# Patient Record
Sex: Female | Born: 1955 | Race: White | Hispanic: No | Marital: Single | Smoking: Former smoker
Health system: Southern US, Community
[De-identification: ages and names within clinical notes are randomized; demographics above are authoritative.]

## PROBLEM LIST (undated history)

## (undated) DIAGNOSIS — F419 Anxiety disorder, unspecified: Secondary | ICD-10-CM

## (undated) DIAGNOSIS — F32A Depression, unspecified: Secondary | ICD-10-CM

## (undated) DIAGNOSIS — H269 Unspecified cataract: Secondary | ICD-10-CM

## (undated) DIAGNOSIS — F329 Major depressive disorder, single episode, unspecified: Secondary | ICD-10-CM

## (undated) DIAGNOSIS — N3281 Overactive bladder: Secondary | ICD-10-CM

## (undated) HISTORY — DX: Unspecified cataract: H26.9

## (undated) HISTORY — PX: EYE SURGERY: SHX253

## (undated) HISTORY — DX: Anxiety disorder, unspecified: F41.9

## (undated) HISTORY — PX: CORNEAL TRANSPLANT: SHX108

---

## 1983-10-11 HISTORY — PX: LAMELLAR KERATOPLASTY: SHX1911

## 1994-10-10 HISTORY — PX: OTHER SURGICAL HISTORY: SHX169

## 2009-10-10 LAB — CONVERTED CEMR LAB: Pap Smear: NORMAL

## 2009-10-10 LAB — HM PAP SMEAR

## 2009-10-15 LAB — CONVERTED CEMR LAB
LDL Cholesterol: 94 mg/dL
LDL Cholesterol: 94 mg/dL

## 2010-10-27 ENCOUNTER — Encounter: Payer: Self-pay | Admitting: Family Medicine

## 2010-11-09 ENCOUNTER — Ambulatory Visit
Admission: RE | Admit: 2010-11-09 | Discharge: 2010-11-09 | Payer: Self-pay | Source: Home / Self Care | Attending: Family Medicine | Admitting: Family Medicine

## 2010-11-09 DIAGNOSIS — N318 Other neuromuscular dysfunction of bladder: Secondary | ICD-10-CM | POA: Insufficient documentation

## 2010-11-09 DIAGNOSIS — G4700 Insomnia, unspecified: Secondary | ICD-10-CM | POA: Insufficient documentation

## 2010-11-09 DIAGNOSIS — B0052 Herpesviral keratitis: Secondary | ICD-10-CM | POA: Insufficient documentation

## 2010-11-16 ENCOUNTER — Other Ambulatory Visit: Payer: Self-pay | Admitting: Family Medicine

## 2010-11-16 ENCOUNTER — Other Ambulatory Visit: Payer: Self-pay

## 2010-11-16 ENCOUNTER — Encounter (INDEPENDENT_AMBULATORY_CARE_PROVIDER_SITE_OTHER): Payer: Self-pay | Admitting: *Deleted

## 2010-11-16 DIAGNOSIS — Z1211 Encounter for screening for malignant neoplasm of colon: Secondary | ICD-10-CM

## 2010-11-16 LAB — FECAL OCCULT BLOOD, GUAIAC: Fecal Occult Blood: NEGATIVE

## 2010-11-16 LAB — FECAL OCCULT BLOOD, IMMUNOCHEMICAL: Fecal Occult Bld: NEGATIVE

## 2010-11-17 NOTE — Assessment & Plan Note (Signed)
Summary: NEW PT TO EST/CLE   Vital Signs:  Patient profile:   55 year old female Height:      67.5 inches Weight:      158.25 pounds BMI:     24.51 Temp:     98.6 degrees F oral Pulse rate:   64 / minute Pulse rhythm:   regular BP sitting:   120 / 80  (left arm)  Vitals Entered By: Benny Lennert CMA Duncan Dull) (November 09, 2010 9:49 AM)  History of Present Illness: Chief complaint new patient to be established   Here to establish PCP.Marland Kitchen moved from [place].  Overactive bladder.. well controlle don vesicare.  Insomnia, chronic...lifelong. Uses ambien as needed.Marland Kitchen about 1/3 tab 4-5 times aweek.  Preventive Screening-Counseling & Management  Alcohol-Tobacco     Smoking Status: never  Caffeine-Diet-Exercise     Does Patient Exercise: yes      Drug Use:  no.    Allergies (verified): No Known Drug Allergies  Past History:  Past Surgical History: Partial lamellar keratoplasty 1985 keraplasty 1996  Family History: father: deceased age 76 oral cancer due to tobacco mother: HTN, osteoarthritis three brothers: healthy Paunt... MS  Social History: Occupation: professor at [employer].Marland KitchenMarland KitchenPA Single No sexually active Married Never Smoked Alcohol use-yes, once a year Drug use-no Regular exercise-yes.Marland Kitchen daily treadmill Diet:  veggies, water minimal Occupation:  employed Smoking Status:  never Drug Use:  no Does Patient Exercise:  yes  Review of Systems General:  Denies fatigue and fever. CV:  Denies chest pain or discomfort. Resp:  Denies shortness of breath. GI:  Denies abdominal pain and bloody stools. GU:  Denies dysuria. Psych:  Denies anxiety and depression.  Physical Exam  General:  Well-developed,well-nourished,in no acute distress; alert,appropriate and cooperative throughout examination Ears:  External ear exam shows no significant lesions or deformities.  Otoscopic examination reveals clear canals, tympanic membranes are intact bilaterally without bulging,  retraction, inflammation or discharge. Hearing is grossly normal bilaterally. Nose:  External nasal examination shows no deformity or inflammation. Nasal mucosa are pink and moist without lesions or exudates. Mouth:  Oral mucosa and oropharynx without lesions or exudates.  Teeth in good repair. Neck:  no carotid bruit or thyromegaly no cervical or supraclavicular lymphadenopathy  Chest Wall:  No deformities, masses, or tenderness noted. Breasts:  No mass, nodules, thickening, tenderness, bulging, retraction, inflamation, nipple discharge or skin changes noted.   Lungs:  Normal respiratory effort, chest expands symmetrically. Lungs are clear to auscultation, no crackles or wheezes. Heart:  Normal rate and regular rhythm. S1 and S2 normal without gallop, murmur, click, rub or other extra sounds. Abdomen:  Bowel sounds positive,abdomen soft and non-tender without masses, organomegaly or hernias noted. Genitalia:  Pelvic Exam:        External: normal female genitalia without lesions or masses        Vagina: normal without lesions or masses        Cervix: not visualized         Adnexa: normal bimanual exam without masses or fullness        Uterus: normal by palpation        Pap smear: not performed Msk:  No deformity or scoliosis noted of thoracic or lumbar spine.   Pulses:  R and L posterior tibial pulses are full and equal bilaterally  Extremities:  no edema  Neurologic:  No cranial nerve deficits noted. Station and gait are normal. Plantar reflexes are down-going bilaterally. DTRs are symmetrical throughout. Sensory, motor and coordinative  functions appear intact. Skin:  Intact without suspicious lesions or rashes Psych:  Cognition and judgment appear intact. Alert and cooperative with normal attention span and concentration. No apparent delusions, illusions, hallucinations   Impression & Recommendations:  Problem # 1:  PREVENTIVE HEALTH CARE (ICD-V70.0) The patient's preventative  maintenance and recommended screening tests for an annual wellness exam were reviewed in full today. Brought up to date unless services declined.  Counselled on the importance of diet, exercise, and its role in overall health and mortality. The patient's FH and SH was reviewed, including their home life, tobacco status, and drug and alcohol status.     Problem # 2:  ROUTINE GYNECOLOGICAL EXAMINATION (ICD-V72.31) PAP in past yearly and all normal...have documentation form previous MD. DVE, no pap this year... PAPs q 2 years then space to q3 years.   Problem # 3:  INSOMNIA, CHRONIC (ICD-307.42) Med refilled. Use on limited basis to avoid tolerance. Sleep hygeine reviewed.   Complete Medication List: 1)  Vesicare 5 Mg Tabs (Solifenacin succinate) .... Take one tablet daily 2)  Ambien 10 Mg Tabs (Zolpidem tartrate) .... 1/3 tab po at bedtime as needed insomnia  Patient Instructions: 1)  Make sue to get fasting labs at [facility]. Marland Kitchen 2)  Go ahead and set up mammogram through [facility]. 3)  Stool cards.. return.  4)  Please schedule a follow-up appointment in 1 year.  Prescriptions: AMBIEN 10 MG TABS (ZOLPIDEM TARTRATE) 1/3 tab po at bedtime as needed insomnia  #30 x 0   Entered and Authorized by:   [name] MD   Signed by:   [name] MD on 11/09/2010   Method used:   Print then Give to Patient   RxID:   [id number]    Orders Added: 1)  New Patient 40-64 years [[id number]]    Current Allergies (reviewed today): No known allergies  Flu Vaccine Result Date:  07/10/2010 Flu Vaccine Result:  given Flu Vaccine Next Due:  1 yr TD Result Date:  10/11/2003 TD Result:  given TD Next Due:  10 yr HDL Result Date:  10/15/2009 HDL Result:  86 HDL Next Due:  1 yr LDL Result Date:  10/15/2009 LDL Result:  94 LDL Next Due:  1 yr Flex Sig Next Due:  Not Indicated PAP Result Date:  10/10/2009 PAP Result:  normal PAP Next Due:  1 yr Mammogram Result Date:  10/10/2009 Mammogram Result:   normal Mammogram Next Due:  1 yr Never had a bone density... no family history, no smoking, menses age 4, menopause age 21, no steroids.

## 2010-11-25 NOTE — Letter (Signed)
Summary: Patient Medical Records  Patient Medical Records   Imported By: Kassie Mends 11/16/2010 10:09:12  _____________________________________________________________________  External Attachment:    Type:   Image     Comment:   External Document

## 2010-11-25 NOTE — Letter (Signed)
Summary: Results Follow up Letter  [place] [facility]  [address] [place], [place] [address]   Phone: [phone]  Fax: [phone]    11/16/2010 MRN: [id number]      Margaret Short [address] [place], [place]  [address]  [place]     Dear Ms. Stelmach,  The following are the results of your recent test(s):  Test         Result    Pap Smear:        Normal _____  Not Normal _____ Comments: ______________________________________________________ Cholesterol: LDL(Bad cholesterol):         Your goal is less than:         HDL (Good cholesterol):       Your goal is more than: Comments:  ______________________________________________________ Mammogram:        Normal _____  Not Normal _____ Comments:  ___________________________________________________________________ Hemoccult:        Normal __x___  Not normal _______ Comments:  Repeat in 1 year  _____________________________________________________________________ Other Tests:    We routinely do not discuss normal results over the telephone.  If you desire a copy of the results, or you have any questions about this information we can discuss them at your next office visit.   Sincerely,  [name] MD

## 2010-12-15 ENCOUNTER — Ambulatory Visit: Payer: Self-pay | Admitting: Family Medicine

## 2010-12-15 ENCOUNTER — Encounter: Payer: Self-pay | Admitting: Family Medicine

## 2010-12-22 LAB — HM MAMMOGRAPHY: HM Mammogram: NORMAL

## 2010-12-23 ENCOUNTER — Encounter (INDEPENDENT_AMBULATORY_CARE_PROVIDER_SITE_OTHER): Payer: Self-pay | Admitting: *Deleted

## 2010-12-28 NOTE — Letter (Signed)
Summary: Results Follow up Letter  [place] [facility]  [address] [place], [place] [address]   Phone: [phone]  Fax: [phone]    12/23/2010 MRN: [id number]     Margaret Short [address] [place], [place]  [address]  [place]     Dear [name],  The following are the results of your recent test(s):  Test         Result    Pap Smear:        Normal _____  Not Normal _____ Comments: ______________________________________________________ Cholesterol: LDL(Bad cholesterol):         Your goal is less than:         HDL (Good cholesterol):       Your goal is more than: Comments:  ______________________________________________________ Mammogram:        Normal __x___  Not Normal _____ Comments:Repeat in 1 year  ___________________________________________________________________ Hemoccult:        Normal _____  Not normal _______ Comments:    _____________________________________________________________________ Other Tests:    We routinely do not discuss normal results over the telephone.  If you desire a copy of the results, or you have any questions about this information we can discuss them at your next office visit.   Sincerely,  [name] MD

## 2011-03-03 ENCOUNTER — Other Ambulatory Visit: Payer: Self-pay | Admitting: *Deleted

## 2011-03-03 MED ORDER — ZOLPIDEM TARTRATE 10 MG PO TABS: ORAL_TABLET | ORAL | Status: DC

## 2011-04-20 ENCOUNTER — Encounter: Payer: Self-pay | Admitting: Family Medicine

## 2011-04-26 ENCOUNTER — Encounter: Payer: Self-pay | Admitting: Family Medicine

## 2011-04-26 ENCOUNTER — Ambulatory Visit (INDEPENDENT_AMBULATORY_CARE_PROVIDER_SITE_OTHER): Payer: BC Managed Care – PPO | Admitting: Family Medicine

## 2011-04-26 DIAGNOSIS — N318 Other neuromuscular dysfunction of bladder: Secondary | ICD-10-CM

## 2011-04-26 MED ORDER — SOLIFENACIN SUCCINATE 5 MG PO TABS: 10.0000 mg | ORAL_TABLET | Freq: Every day | ORAL | Status: DC

## 2011-04-26 MED ORDER — SOLIFENACIN SUCCINATE 5 MG PO TABS: 5.0000 mg | ORAL_TABLET | Freq: Every day | ORAL | Status: DC

## 2011-04-26 NOTE — Progress Notes (Signed)
  Subjective:    Patient ID: Margaret Short, female    DOB: August 04, 1956, 55 y.o.   MRN: [id number]  HPI Overactive bladder.. well controlled on vesicare.  Higher dose in past was to extensive. She does still get up 3 times at night.  Insomnia, chronic...lifelong.  Uses ambien as needed.Marland Kitchen about 1/3 tab 4-5 times a week. She does not need any refills of this yet.     Review of Systems  Constitutional: Negative for fever and fatigue.  Cardiovascular: Negative for chest pain and palpitations.  Gastrointestinal: Negative for nausea, vomiting, abdominal pain, diarrhea and constipation.  Genitourinary: Positive for dysuria. Negative for flank pain, vaginal bleeding, vaginal discharge and vaginal pain.       Objective:   Physical Exam  Constitutional: Vital signs are normal. She appears well-developed and well-nourished. She is cooperative.  Non-toxic appearance. She does not appear ill. No distress.  HENT:  Right Ear: Hearing, tympanic membrane and ear canal normal.  Left Ear: Hearing, tympanic membrane and ear canal normal.  Nose: Nose normal.  Eyes: Lids are normal. No foreign bodies found.  Neck: Trachea normal. Carotid bruit is not present. No mass present.  Cardiovascular: Normal rate, regular rhythm, S1 normal, S2 normal, normal heart sounds and intact distal pulses.  Exam reveals no gallop.   No murmur heard. Pulmonary/Chest: Effort normal and breath sounds normal. No respiratory distress. She has no wheezes. She has no rhonchi. She has no rales.  Abdominal: Soft. Normal appearance and bowel sounds are normal. She exhibits no distension, no fluid wave, no abdominal bruit and no mass. There is no hepatosplenomegaly. There is no tenderness. There is no rebound, no guarding and no CVA tenderness. No hernia.  Skin: Skin is warm, dry and intact.          Assessment & Plan:

## 2011-04-26 NOTE — Assessment & Plan Note (Signed)
Doing well overall on vesicare low dose. Does not wish to increase. Refilled med to mail order.

## 2011-04-26 NOTE — Patient Instructions (Signed)
Follow up when due for complete physical exam, or earlier if needed.

## 2011-04-27 ENCOUNTER — Telehealth: Payer: Self-pay | Admitting: *Deleted

## 2011-04-27 NOTE — Telephone Encounter (Signed)
Clarification is needed on directions for vesicare, form is on your desk.

## 2011-04-28 NOTE — Telephone Encounter (Signed)
Pt called back to ask when script will be straightened out, advised her we will fax form on Friday, after Dr. Ermalene Searing returns to office.

## 2011-05-24 ENCOUNTER — Other Ambulatory Visit: Payer: Self-pay | Admitting: *Deleted

## 2011-05-24 MED ORDER — ZOLPIDEM TARTRATE 10 MG PO TABS: 10.0000 mg | ORAL_TABLET | Freq: Every evening | ORAL | Status: DC | PRN

## 2011-05-24 NOTE — Telephone Encounter (Signed)
Pt is asking that a 90 day supply be sent to [facility].

## 2011-05-24 NOTE — Telephone Encounter (Signed)
Printed and will fax to [facility]

## 2011-07-08 ENCOUNTER — Ambulatory Visit (INDEPENDENT_AMBULATORY_CARE_PROVIDER_SITE_OTHER): Payer: BC Managed Care – PPO | Admitting: Family Medicine

## 2011-07-08 ENCOUNTER — Encounter: Payer: Self-pay | Admitting: Family Medicine

## 2011-07-08 DIAGNOSIS — F324 Major depressive disorder, single episode, in partial remission: Secondary | ICD-10-CM | POA: Insufficient documentation

## 2011-07-08 DIAGNOSIS — F341 Dysthymic disorder: Secondary | ICD-10-CM

## 2011-07-08 DIAGNOSIS — F418 Other specified anxiety disorders: Secondary | ICD-10-CM

## 2011-07-08 MED ORDER — VENLAFAXINE HCL ER 37.5 MG PO TB24: ORAL_TABLET | ORAL | Status: DC

## 2011-07-08 NOTE — Patient Instructions (Signed)
Start venlafaxine daily.. Increase if tolerated. Call if interested in counseling or if having new issues.

## 2011-07-08 NOTE — Progress Notes (Signed)
  Subjective:    Patient ID: Margaret Short, female    DOB: 12-May-1956, 55 y.o.   MRN: [id number]  HPI  55 year old female presents with  6 months of anxiety and depression. Worse in last 1 month. Increase stress and responsibilities... Misses home in [place]. 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 Difficulty coping and concentrating. Tearfulful. Sleep moderate control.. Using Ambien frequently. Decreased motivation. No SI... Belief system keeps her from doing this.  Has never been on medication for this before.      Review of Systems  Constitutional: Positive for fatigue. Negative for fever and chills.       No cold intolerence or dry skin  Respiratory: Negative for shortness of breath.   Cardiovascular: Negative for chest pain.       Objective:   Physical Exam  Constitutional: Vital signs are normal. She appears well-developed and well-nourished. She is cooperative.  Non-toxic appearance. She does not appear ill. No distress.  HENT:  Head: Normocephalic.  Right Ear: Ear canal normal.  Nose: Mucosal edema and rhinorrhea present. Right sinus exhibits no maxillary sinus tenderness and no frontal sinus tenderness. Left sinus exhibits no maxillary sinus tenderness and no frontal sinus tenderness.  Mouth/Throat: Uvula is midline and mucous membranes are normal.  Eyes: Conjunctivae, EOM and lids are normal. Pupils are equal, round, and reactive to light. No foreign bodies found.  Neck: Trachea normal and normal range of motion. Neck supple. Carotid bruit is not present. No mass and no thyromegaly present.  Cardiovascular: Normal rate, regular rhythm, S1 normal, S2 normal, normal heart sounds, intact distal pulses and normal pulses.  Exam reveals no gallop and no friction rub.   No murmur heard. Pulmonary/Chest: Effort normal and breath sounds normal. Not tachypneic. No respiratory distress. She has no decreased breath sounds. She has no wheezes. She has no  rhonchi. She has no rales.  Genitourinary: Vagina normal and uterus normal.  Neurological: She is alert.  Skin: Skin is warm, dry and intact. No rash noted.  Psychiatric: Her speech is normal and behavior is normal. Judgment normal. Her mood appears not anxious. Cognition and memory are normal. She does not exhibit a depressed mood.          Assessment & Plan:

## 2011-07-08 NOTE — Assessment & Plan Note (Signed)
Not interested in counseling given time constraints. NO SI. Start venlafaxine daily... Follow up in 1 month.

## 2011-07-18 ENCOUNTER — Telehealth: Payer: Self-pay | Admitting: *Deleted

## 2011-07-18 NOTE — Telephone Encounter (Signed)
Pt called to report that she stopped effexor due to having diarrhea.  She wants to start generic zoloft. Uses [facility].

## 2011-07-19 NOTE — Telephone Encounter (Signed)
Left message on machine to call back  

## 2011-07-19 NOTE — Telephone Encounter (Signed)
Not a super common SE of effexor.Marland Kitchen Has it stopped completely off effexor?

## 2011-07-20 MED ORDER — SERTRALINE HCL 50 MG PO TABS: 50.0000 mg | ORAL_TABLET | Freq: Every day | ORAL | Status: DC

## 2011-07-20 NOTE — Telephone Encounter (Signed)
Notify pt .Marland Kitchen Sertraline sent in.

## 2011-07-20 NOTE — Telephone Encounter (Signed)
Patient advised.

## 2011-07-20 NOTE — Telephone Encounter (Signed)
Pt states diarrhea has totally stopped since being off effexor.

## 2011-08-08 ENCOUNTER — Ambulatory Visit (INDEPENDENT_AMBULATORY_CARE_PROVIDER_SITE_OTHER): Payer: BC Managed Care – PPO | Admitting: Family Medicine

## 2011-08-08 ENCOUNTER — Encounter: Payer: Self-pay | Admitting: Family Medicine

## 2011-08-08 VITALS — BP 130/80 | HR 78 | Temp 98.8°F | Ht 67.5 in | Wt 160.8 lb

## 2011-08-08 DIAGNOSIS — F418 Other specified anxiety disorders: Secondary | ICD-10-CM

## 2011-08-08 DIAGNOSIS — F341 Dysthymic disorder: Secondary | ICD-10-CM

## 2011-08-08 MED ORDER — SERTRALINE HCL 50 MG PO TABS: 50.0000 mg | ORAL_TABLET | Freq: Every day | ORAL | Status: DC

## 2011-08-08 NOTE — Patient Instructions (Signed)
Continue sertraline at current dose.. Call if not improving for possible increase in dose.  Stop to speak with Shirlee Limerick about referral to counselor on your way out. Follow up in 2-3 weeks. Helpline [phone].

## 2011-08-08 NOTE — Assessment & Plan Note (Signed)
Poor control. Recommended counseling again and referral made. Continue sertraline longer , may need increase. Close follow up given worsening mood as opposed to improvement.

## 2011-08-08 NOTE — Progress Notes (Signed)
  Subjective:    Patient ID: Margaret Short, female    DOB: March 26, 1956, 55 y.o.   MRN: [id number]  HPI  55 year old femeale presents for follow up depression with anxiety, and chronic insomnia.  She is on sertraline 50 mg daily ( has been on for 2-3 weeks at this point) and ambien prn.  She is having a lot of dry mouth. She was initially put on effexor but stopped this due to severe diarrhea, now resolved. She reports that her mood is worse as opposed to better.. Continued diff concentrating, issues at work, not performing well, overwhelmed. No SI. Ambien is helping with sleep.  Not interested in xanax given no panic attacks, just generalized anxiety.  Minimal people to talk with about issues, but hard to find time for counseling (willing to try now)    Review of Systems  Constitutional: Negative for fever and fatigue.  Respiratory: Negative for chest tightness and shortness of breath.   Cardiovascular: Negative for chest pain, palpitations and leg swelling.  Genitourinary: Negative for dysuria.  Psychiatric/Behavioral: Positive for sleep disturbance, dysphoric mood, decreased concentration and agitation. Negative for suicidal ideas, hallucinations and self-injury. The patient is nervous/anxious.        Objective:   Physical Exam  Constitutional: She is oriented to person, place, and time. She appears well-developed and well-nourished.  Neck: Normal range of motion. Neck supple. No thyromegaly present.  Cardiovascular: Normal rate, regular rhythm, normal heart sounds and intact distal pulses.  Exam reveals no gallop and no friction rub.   No murmur heard. Pulmonary/Chest: Effort normal and breath sounds normal.  Abdominal: Soft. Bowel sounds are normal.  Neurological: She is alert and oriented to person, place, and time.  Psychiatric: Her speech is normal. Judgment and thought content normal. Her affect is blunt. She is slowed and withdrawn. Thought content is not paranoid.  Cognition and memory are normal. She exhibits a depressed mood. She expresses no suicidal ideation. She expresses no suicidal plans.          Assessment & Plan:

## 2011-08-23 ENCOUNTER — Ambulatory Visit (INDEPENDENT_AMBULATORY_CARE_PROVIDER_SITE_OTHER): Payer: BC Managed Care – PPO | Admitting: Psychology

## 2011-08-23 DIAGNOSIS — F4323 Adjustment disorder with mixed anxiety and depressed mood: Secondary | ICD-10-CM

## 2011-08-26 ENCOUNTER — Ambulatory Visit (INDEPENDENT_AMBULATORY_CARE_PROVIDER_SITE_OTHER): Payer: BC Managed Care – PPO | Admitting: Family Medicine

## 2011-08-26 ENCOUNTER — Encounter: Payer: Self-pay | Admitting: Family Medicine

## 2011-08-26 DIAGNOSIS — F341 Dysthymic disorder: Secondary | ICD-10-CM

## 2011-08-26 DIAGNOSIS — F418 Other specified anxiety disorders: Secondary | ICD-10-CM

## 2011-08-26 MED ORDER — ZOLPIDEM TARTRATE 10 MG PO TABS: 10.0000 mg | ORAL_TABLET | Freq: Every evening | ORAL | Status: DC | PRN

## 2011-08-26 NOTE — Patient Instructions (Signed)
Schedule CPX with fasting labs sometime next early year.

## 2011-08-26 NOTE — Assessment & Plan Note (Signed)
Great improvement on sertraline, continue likely for 6 months. Discussed not stopping med abruptly.

## 2011-08-26 NOTE — Progress Notes (Signed)
  Subjective:    Patient ID: Margaret Short, female    DOB: June 09, 1956, 55 y.o.   MRN: [id number]  HPI Depression: She report 99% better.  Able to sleep at night, calm, no tearfulness, no anxiety. She is only using 5 mg daily now. No SI, no HI.  Dry mouth SE tolerable.  She saw Dr. Laymond Purser  Last week.. No follow up planned.    Review of Systems  Constitutional: Negative for fever and fatigue.  HENT: Negative for ear pain.   Eyes: Negative for pain.  Respiratory: Negative for chest tightness and shortness of breath.   Cardiovascular: Negative for chest pain, palpitations and leg swelling.  Gastrointestinal: Negative for abdominal pain.  Genitourinary: Negative for dysuria.       Objective:   Physical Exam  Constitutional: She appears well-developed and well-nourished.  Cardiovascular: Normal rate, regular rhythm, normal heart sounds and intact distal pulses.  Exam reveals no gallop and no friction rub.   No murmur heard. Pulmonary/Chest: Effort normal and breath sounds normal. No respiratory distress. She has no wheezes. She has no rales. She exhibits no tenderness.  Psychiatric: She has a normal mood and affect. Her behavior is normal. Judgment and thought content normal.          Assessment & Plan:

## 2011-12-09 ENCOUNTER — Other Ambulatory Visit: Payer: Self-pay | Admitting: *Deleted

## 2011-12-09 MED ORDER — SERTRALINE HCL 50 MG PO TABS: 50.0000 mg | ORAL_TABLET | Freq: Every day | ORAL | Status: DC

## 2012-01-24 ENCOUNTER — Other Ambulatory Visit: Payer: Self-pay | Admitting: *Deleted

## 2012-01-24 NOTE — Telephone Encounter (Signed)
Last filled 08-26-2011

## 2012-01-25 MED ORDER — ZOLPIDEM TARTRATE 10 MG PO TABS: 10.0000 mg | ORAL_TABLET | Freq: Every evening | ORAL | Status: DC | PRN

## 2012-01-25 NOTE — Telephone Encounter (Signed)
rx called to pharmacy 

## 2012-04-06 ENCOUNTER — Encounter: Payer: Self-pay | Admitting: Family Medicine

## 2012-04-06 ENCOUNTER — Other Ambulatory Visit (HOSPITAL_COMMUNITY)
Admission: RE | Admit: 2012-04-06 | Discharge: 2012-04-06 | Disposition: A | Discharge status: Home / Self Care | Payer: BC Managed Care – PPO | Source: Ambulatory Visit | Attending: Family Medicine | Admitting: Family Medicine

## 2012-04-06 ENCOUNTER — Ambulatory Visit (INDEPENDENT_AMBULATORY_CARE_PROVIDER_SITE_OTHER): Payer: BC Managed Care – PPO | Admitting: Family Medicine

## 2012-04-06 VITALS — BP 128/78 | HR 76 | Temp 98.4°F | Ht 68.0 in | Wt 162.5 lb

## 2012-04-06 DIAGNOSIS — F418 Other specified anxiety disorders: Secondary | ICD-10-CM

## 2012-04-06 DIAGNOSIS — F341 Dysthymic disorder: Secondary | ICD-10-CM

## 2012-04-06 DIAGNOSIS — G4700 Insomnia, unspecified: Secondary | ICD-10-CM

## 2012-04-06 DIAGNOSIS — N318 Other neuromuscular dysfunction of bladder: Secondary | ICD-10-CM

## 2012-04-06 DIAGNOSIS — Z1159 Encounter for screening for other viral diseases: Secondary | ICD-10-CM | POA: Insufficient documentation

## 2012-04-06 DIAGNOSIS — Z1212 Encounter for screening for malignant neoplasm of rectum: Secondary | ICD-10-CM

## 2012-04-06 DIAGNOSIS — Z01419 Encounter for gynecological examination (general) (routine) without abnormal findings: Secondary | ICD-10-CM | POA: Insufficient documentation

## 2012-04-06 DIAGNOSIS — Z0000 Encounter for general adult medical examination without abnormal findings: Secondary | ICD-10-CM

## 2012-04-06 LAB — FECAL OCCULT BLOOD, GUAIAC: Fecal Occult Blood: NEGATIVE

## 2012-04-06 NOTE — Addendum Note (Signed)
Addended by: Josph Macho A on: 04/06/2012 10:58 AM   Modules accepted: Orders

## 2012-04-06 NOTE — Progress Notes (Signed)
Subjective:    Patient ID: Margaret Short, female    DOB: [birth year], 56 y.o.   MRN: [id number]  HPI The patient is here for annual wellness exam and preventative care.    Depression, stable control on zoloft. No SI, no HI.  Insomnia chronic: stable control on ambien 5 mg at a time, using frequently but not daily. Trying to decrease.  Overactive bladder stable on vesicare.   Review of Systems  Constitutional: Negative for fever, fatigue and unexpected weight change.  HENT: Negative for ear pain, congestion, sore throat, sneezing, trouble swallowing and sinus pressure.   Eyes: Negative for pain and itching.  Respiratory: Negative for cough, shortness of breath and wheezing.   Cardiovascular: Negative for chest pain, palpitations and leg swelling.  Gastrointestinal: Negative for nausea, abdominal pain, diarrhea, constipation and blood in stool.  Genitourinary: Negative for dysuria, hematuria, vaginal discharge, difficulty urinating and menstrual problem.  Musculoskeletal:       Some arthritis pain in neck, better with stretching exercises.  Skin: Negative for rash.  Neurological: Negative for syncope, weakness, light-headedness, numbness and headaches.  Psychiatric/Behavioral: Negative for confusion and dysphoric mood. The patient is not nervous/anxious.        Objective:   Physical Exam  Constitutional: Vital signs are normal. She appears well-developed and well-nourished. She is cooperative.  Non-toxic appearance. She does not appear ill. No distress.  HENT:  Head: Normocephalic.  Right Ear: Hearing, tympanic membrane, external ear and ear canal normal.  Left Ear: Hearing, tympanic membrane, external ear and ear canal normal.  Nose: Nose normal.  Eyes: Conjunctivae, EOM and lids are normal. Pupils are equal, round, and reactive to light. No foreign bodies found.  Neck: Trachea normal and normal range of motion. Neck supple. Carotid bruit is not present. No mass and no  thyromegaly present.  Cardiovascular: Normal rate, regular rhythm, S1 normal, S2 normal, normal heart sounds and intact distal pulses.  Exam reveals no gallop.   No murmur heard. Pulmonary/Chest: Effort normal and breath sounds normal. No respiratory distress. She has no wheezes. She has no rhonchi. She has no rales.  Abdominal: Soft. Normal appearance and bowel sounds are normal. She exhibits no distension, no fluid wave, no abdominal bruit and no mass. There is no hepatosplenomegaly. There is no tenderness. There is no rebound, no guarding and no CVA tenderness. No hernia.  Genitourinary: Vagina normal and uterus normal. No breast swelling, tenderness, discharge or bleeding. Pelvic exam was performed with patient prone. There is no rash, tenderness or lesion on the right labia. There is no rash, tenderness or lesion on the left labia. Uterus is not enlarged and not tender. Cervix exhibits no motion tenderness, no discharge and no friability. Right adnexum displays no mass, no tenderness and no fullness. Left adnexum displays no mass, no tenderness and no fullness.  Lymphadenopathy:    She has no cervical adenopathy.    She has no axillary adenopathy.  Neurological: She is alert. She has normal strength. No cranial nerve deficit or sensory deficit.  Skin: Skin is warm, dry and intact. No rash noted.  Psychiatric: Her speech is normal and behavior is normal. Judgment normal. Her mood appears not anxious. Cognition and memory are normal. She does not exhibit a depressed mood.          Assessment & Plan:  The patient's preventative maintenance and recommended screening tests for an annual wellness exam were reviewed in full today. Brought up to date unless services declined.  Counselled on  the importance of diet, exercise, and its role in overall health and mortality. The patient's FH and SH was reviewed, including their home life, tobacco status, and drug and alcohol status.   Vaccines:Td  uptodate. Nonsmoker: DVE/PAP: PAP in past yearly and all normal...have documentation form previous MD.  DVE, pap this year... PAPs then q3 years.  Mammo: Due last done 12/2010  Colon:No colonoscopy.. IFOB neg 2012, repeat

## 2012-04-06 NOTE — Patient Instructions (Addendum)
Schedule mammogram. Stop by lab to pick up iFOB. Continue work on exercise and healthy eating.

## 2012-04-06 NOTE — Assessment & Plan Note (Signed)
Stable control on ambien occ.

## 2012-04-06 NOTE — Assessment & Plan Note (Signed)
Stable control on vesicare.

## 2012-04-06 NOTE — Addendum Note (Signed)
Addended by: Alvina Chou on: 04/06/2012 11:02 AM   Modules accepted: Orders

## 2012-04-06 NOTE — Assessment & Plan Note (Signed)
Well controlled on sertraline.

## 2012-04-13 ENCOUNTER — Other Ambulatory Visit: Payer: BC Managed Care – PPO

## 2012-04-13 ENCOUNTER — Encounter: Payer: Self-pay | Admitting: *Deleted

## 2012-04-13 ENCOUNTER — Telehealth: Payer: Self-pay | Admitting: Family Medicine

## 2012-04-13 DIAGNOSIS — Z1212 Encounter for screening for malignant neoplasm of rectum: Secondary | ICD-10-CM

## 2012-04-13 NOTE — Telephone Encounter (Signed)
Notify patient that her pap smear is normal and there is no high risk HPV seen. We will repeat pelvic exam in 1 year.  

## 2012-04-13 NOTE — Telephone Encounter (Signed)
Letter mailed

## 2012-05-01 ENCOUNTER — Other Ambulatory Visit: Payer: Self-pay | Admitting: *Deleted

## 2012-05-01 MED ORDER — SOLIFENACIN SUCCINATE 5 MG PO TABS: 5.0000 mg | ORAL_TABLET | Freq: Every day | ORAL | Status: DC

## 2012-05-01 MED ORDER — ZOLPIDEM TARTRATE 10 MG PO TABS: 10.0000 mg | ORAL_TABLET | Freq: Every evening | ORAL | Status: DC | PRN

## 2012-05-01 MED ORDER — ZOLPIDEM TARTRATE 10 MG PO TABS
10.0000 mg | ORAL_TABLET | Freq: Every evening | ORAL | Status: DC | PRN
Start: 2012-05-01 — End: 2012-05-01

## 2012-05-01 NOTE — Telephone Encounter (Signed)
Ambien faxed to [facility].

## 2012-05-01 NOTE — Telephone Encounter (Signed)
Received faxed refill request from pharmacy. Last office visit 04/06/12. Is it okay to refill medication.

## 2012-05-03 ENCOUNTER — Ambulatory Visit: Payer: Self-pay | Admitting: Family Medicine

## 2012-05-10 ENCOUNTER — Encounter: Payer: Self-pay | Admitting: Family Medicine

## 2012-09-21 ENCOUNTER — Other Ambulatory Visit: Payer: Self-pay | Admitting: *Deleted

## 2012-09-21 MED ORDER — ZOLPIDEM TARTRATE 10 MG PO TABS: 10.0000 mg | ORAL_TABLET | Freq: Every evening | ORAL | Status: DC | PRN

## 2012-09-21 MED ORDER — SERTRALINE HCL 50 MG PO TABS: 50.0000 mg | ORAL_TABLET | Freq: Every day | ORAL | Status: DC

## 2012-09-21 NOTE — Telephone Encounter (Signed)
Wants for mail order pharmacy 90 day supply

## 2012-09-21 NOTE — Telephone Encounter (Signed)
rx called to pharmacy 

## 2012-12-17 ENCOUNTER — Other Ambulatory Visit: Payer: Self-pay | Admitting: *Deleted

## 2012-12-17 MED ORDER — SERTRALINE HCL 50 MG PO TABS: 50.0000 mg | ORAL_TABLET | Freq: Every day | ORAL | Status: DC

## 2012-12-18 MED ORDER — ZOLPIDEM TARTRATE 10 MG PO TABS: 10.0000 mg | ORAL_TABLET | Freq: Every evening | ORAL | Status: DC | PRN

## 2012-12-19 MED ORDER — ZOLPIDEM TARTRATE 10 MG PO TABS: 10.0000 mg | ORAL_TABLET | Freq: Every evening | ORAL | Status: DC | PRN

## 2012-12-19 NOTE — Addendum Note (Signed)
Addended by: Consuello Masse on: 12/19/2012 08:47 AM   Modules accepted: Orders

## 2013-04-05 ENCOUNTER — Encounter: Payer: Self-pay | Admitting: Radiology

## 2013-04-08 ENCOUNTER — Ambulatory Visit (INDEPENDENT_AMBULATORY_CARE_PROVIDER_SITE_OTHER): Payer: BC Managed Care – PPO | Admitting: Family Medicine

## 2013-04-08 ENCOUNTER — Encounter: Payer: Self-pay | Admitting: Family Medicine

## 2013-04-08 VITALS — BP 130/80 | HR 77 | Temp 98.1°F | Ht 67.5 in | Wt 167.5 lb

## 2013-04-08 DIAGNOSIS — G4700 Insomnia, unspecified: Secondary | ICD-10-CM

## 2013-04-08 DIAGNOSIS — N318 Other neuromuscular dysfunction of bladder: Secondary | ICD-10-CM

## 2013-04-08 DIAGNOSIS — F418 Other specified anxiety disorders: Secondary | ICD-10-CM

## 2013-04-08 DIAGNOSIS — Z0000 Encounter for general adult medical examination without abnormal findings: Secondary | ICD-10-CM

## 2013-04-08 DIAGNOSIS — Z1212 Encounter for screening for malignant neoplasm of rectum: Secondary | ICD-10-CM

## 2013-04-08 DIAGNOSIS — F341 Dysthymic disorder: Secondary | ICD-10-CM

## 2013-04-08 MED ORDER — ZOLPIDEM TARTRATE 10 MG PO TABS: 10.0000 mg | ORAL_TABLET | Freq: Every evening | ORAL | Status: DC | PRN

## 2013-04-08 MED ORDER — SOLIFENACIN SUCCINATE 5 MG PO TABS: 5.0000 mg | ORAL_TABLET | Freq: Every day | ORAL | Status: DC

## 2013-04-08 NOTE — Assessment & Plan Note (Signed)
Well controlled on sertraline.

## 2013-04-08 NOTE — Progress Notes (Addendum)
HPI  The patient is here for annual wellness exam and preventative care.   Depression, stable control on zoloft. No SI, no HI.  No SE.   Having some issues with weight gain inlast 5 years. Exercising daily, but poor eating habits. Wt Readings from Last 3 Encounters:  04/08/13 167 lb 8 oz (75.978 kg)  04/06/12 162 lb 8 oz (73.71 kg)  08/26/11 160 lb (72.576 kg)     Insomnia chronic: stable control on ambien 5 mg at a time, using nightly but at 1/2 dose.  Overactive bladder stable on vesicare.  Needs refil.  Labs done at work.  Review of Systems  Constitutional: Negative for fever, fatigue and unexpected weight change.  HENT: Negative for ear pain, congestion, sore throat, sneezing, trouble swallowing and sinus pressure.  Eyes: Negative for pain and itching.  Respiratory: Negative for cough, shortness of breath and wheezing.  Cardiovascular: Negative for chest pain, palpitations and leg swelling.  Gastrointestinal: Negative for nausea, abdominal pain, diarrhea, constipation and blood in stool.  Genitourinary: Negative for dysuria, hematuria, vaginal discharge, difficulty urinating and menstrual problem.  Musculoskeletal:  Some arthritis pain in neck, and in feet, not very bothersome Skin: Negative for rash.  Neurological: Negative for syncope, weakness, light-headedness, numbness and headaches.  Psychiatric/Behavioral: Negative for confusion and dysphoric mood. The patient is not nervous/anxious.  Objective:   Physical Exam  Constitutional: Vital signs are normal. She appears well-developed and well-nourished. She is cooperative. Non-toxic appearance. She does not appear ill. No distress.  HENT:  Head: Normocephalic.  Right Ear: Hearing, tympanic membrane, external ear and ear canal normal.  Left Ear: Hearing, tympanic membrane, external ear and ear canal normal.  Nose: Nose normal.  Eyes: Conjunctivae, EOM and lids are normal. Pupils are equal, round, and reactive to light. No  foreign bodies found.  Neck: Trachea normal and normal range of motion. Neck supple. Carotid bruit is not present. No mass and no thyromegaly present.  Cardiovascular: Normal rate, regular rhythm, S1 normal, S2 normal, normal heart sounds and intact distal pulses. Exam reveals no gallop.  No murmur heard.  Pulmonary/Chest: Effort normal and breath sounds normal. No respiratory distress. She has no wheezes. She has no rhonchi. She has no rales.  Abdominal: Soft. Normal appearance and bowel sounds are normal. She exhibits no distension, no fluid wave, no abdominal bruit and no mass. There is no hepatosplenomegaly. There is no tenderness. There is no rebound, no guarding and no CVA tenderness. No hernia.  Genitourinary: Vagina normal and uterus normal. No breast swelling, tenderness, discharge or bleeding. Pelvic exam was performed with patient supine. There is no rash, tenderness or lesion on the right labia. There is no rash, tenderness or lesion on the left labia. Uterus is not enlarged and not tender. Cervix enot examined. Right adnexum displays no mass, no tenderness and no fullness. Left adnexum displays no mass, no tenderness and no fullness.  Lymphadenopathy:  She has no cervical adenopathy.  She has no axillary adenopathy.  Neurological: She is alert. She has normal strength. No cranial nerve deficit or sensory deficit.  Skin: Skin is warm, dry and intact. No rash noted.  Psychiatric: Her speech is normal and behavior is normal. Judgment normal. Her mood appears not anxious. Cognition and memory are normal. She does not exhibit a depressed mood.  Assessment & Plan:   The patient's preventative maintenance and recommended screening tests for an annual wellness exam were reviewed in full today.  Brought up to date unless services  declined.  Counselled on the importance of diet, exercise, and its role in overall health and mortality.  The patient's FH and SH was reviewed, including their home  life, tobacco status, and drug and alcohol status.   Vaccines:Td uptodate.  Nonsmoker. DVE/PAP: .DVE yearly, pap nml 2013 on q3 year schedule.  Mammo: Due last done 12/2011 DEXA: No family history of osteoporosis, low risk. Colon: No colonoscopy.. IFOB neg 2013, repeat yearly

## 2013-04-08 NOTE — Patient Instructions (Addendum)
Call to schedule mamogram on your own. Stop at lab to pick up stool test. We will call about lab results. Work on [employer], continue regular exercsie and work on weight loss.

## 2013-04-08 NOTE — Assessment & Plan Note (Signed)
Stable on vesicare

## 2013-04-08 NOTE — Assessment & Plan Note (Signed)
Stable control on ambien. 

## 2013-04-09 LAB — BASIC METABOLIC PANEL
BUN: 14 mg/dL (ref 4–21)
Creatinine: 0.8 mg/dL (ref 0.5–1.1)
Potassium: 4.6 mmol/L (ref 3.4–5.3)

## 2013-04-09 LAB — LIPID PANEL: Triglycerides: 69 mg/dL (ref 40–160)

## 2013-04-09 LAB — HEPATIC FUNCTION PANEL: AST: 24 U/L (ref 13–35)

## 2013-04-11 ENCOUNTER — Encounter: Payer: Self-pay | Admitting: Family Medicine

## 2013-04-17 ENCOUNTER — Other Ambulatory Visit (INDEPENDENT_AMBULATORY_CARE_PROVIDER_SITE_OTHER): Payer: BC Managed Care – PPO

## 2013-04-17 DIAGNOSIS — Z1212 Encounter for screening for malignant neoplasm of rectum: Secondary | ICD-10-CM

## 2013-04-17 LAB — FECAL OCCULT BLOOD, IMMUNOCHEMICAL: Fecal Occult Bld: NEGATIVE

## 2013-04-19 ENCOUNTER — Encounter: Payer: Self-pay | Admitting: *Deleted

## 2013-04-19 ENCOUNTER — Encounter: Payer: Self-pay | Admitting: Family Medicine

## 2013-05-07 ENCOUNTER — Ambulatory Visit: Payer: Self-pay | Admitting: Family Medicine

## 2013-05-07 ENCOUNTER — Encounter: Payer: Self-pay | Admitting: Family Medicine

## 2013-05-08 ENCOUNTER — Encounter: Payer: Self-pay | Admitting: Family Medicine

## 2013-08-01 ENCOUNTER — Other Ambulatory Visit: Payer: Self-pay | Admitting: *Deleted

## 2013-08-01 MED ORDER — ZOLPIDEM TARTRATE 10 MG PO TABS: 10.0000 mg | ORAL_TABLET | Freq: Every evening | ORAL | Status: DC | PRN

## 2013-08-01 NOTE — Telephone Encounter (Signed)
Faxed to [facility] [phone].

## 2013-08-01 NOTE — Telephone Encounter (Signed)
Last office visit 04/08/2013.  Ok to refill?

## 2013-08-13 ENCOUNTER — Other Ambulatory Visit: Payer: Self-pay | Admitting: *Deleted

## 2013-08-13 NOTE — Telephone Encounter (Signed)
Last office visit 04/08/2013.  Ok to refill? 

## 2013-08-14 MED ORDER — SERTRALINE HCL 50 MG PO TABS: 50.0000 mg | ORAL_TABLET | Freq: Every day | ORAL | Status: DC

## 2013-08-15 ENCOUNTER — Other Ambulatory Visit: Payer: Self-pay

## 2013-12-23 ENCOUNTER — Other Ambulatory Visit: Payer: Self-pay | Admitting: Family Medicine

## 2013-12-23 NOTE — Telephone Encounter (Signed)
Last filled 08/01/13

## 2013-12-24 MED ORDER — ZOLPIDEM TARTRATE 10 MG PO TABS: 10.0000 mg | ORAL_TABLET | Freq: Every evening | ORAL | Status: DC | PRN

## 2013-12-24 NOTE — Telephone Encounter (Signed)
Called to [facility]. 

## 2014-01-06 ENCOUNTER — Other Ambulatory Visit: Payer: Self-pay | Admitting: Family Medicine

## 2014-01-26 ENCOUNTER — Other Ambulatory Visit: Payer: Self-pay | Admitting: Family Medicine

## 2014-04-18 ENCOUNTER — Encounter: Payer: Self-pay | Admitting: Family Medicine

## 2014-04-18 ENCOUNTER — Ambulatory Visit (INDEPENDENT_AMBULATORY_CARE_PROVIDER_SITE_OTHER): Payer: BC Managed Care – PPO | Admitting: Family Medicine

## 2014-04-18 VITALS — BP 110/80 | HR 71 | Temp 98.3°F | Ht 66.26 in | Wt 170.8 lb

## 2014-04-18 DIAGNOSIS — Z1212 Encounter for screening for malignant neoplasm of rectum: Secondary | ICD-10-CM

## 2014-04-18 DIAGNOSIS — N318 Other neuromuscular dysfunction of bladder: Secondary | ICD-10-CM

## 2014-04-18 DIAGNOSIS — Z0000 Encounter for general adult medical examination without abnormal findings: Secondary | ICD-10-CM

## 2014-04-18 DIAGNOSIS — Z23 Encounter for immunization: Secondary | ICD-10-CM

## 2014-04-18 DIAGNOSIS — G4700 Insomnia, unspecified: Secondary | ICD-10-CM

## 2014-04-18 DIAGNOSIS — F418 Other specified anxiety disorders: Secondary | ICD-10-CM

## 2014-04-18 DIAGNOSIS — F341 Dysthymic disorder: Secondary | ICD-10-CM

## 2014-04-18 NOTE — Assessment & Plan Note (Signed)
Stable on sertraline.

## 2014-04-18 NOTE — Progress Notes (Signed)
The patient is here for annual wellness exam and preventative care.   Depression, stable control on zoloft. No SI, no HI. No SE.    Exercising daily, but poor eating habits.  Wt Readings from Last 3 Encounters:  04/18/14 170 lb 12 oz (77.452 kg)  04/08/13 167 lb 8 oz (75.978 kg)  04/06/12 162 lb 8 oz (73.71 kg)   Insomnia chronic: stable control on ambien 5 mg at a time, using nightly but at 1/2 dose.   Overactive bladder stable on vesicare. Needs refill.   Labs done at work.   Review of Systems  Constitutional: Negative for fever, fatigue and unexpected weight change.  HENT: Negative for ear pain, congestion, sore throat, sneezing, trouble swallowing and sinus pressure.  Eyes: Negative for pain and itching.  Respiratory: Negative for cough, shortness of breath and wheezing.  Cardiovascular: Negative for chest pain, palpitations and leg swelling.  Gastrointestinal: Negative for nausea, abdominal pain, diarrhea, constipation and blood in stool.  Genitourinary: Negative for dysuria, hematuria, vaginal discharge, difficulty urinating and menstrual problem.  Musculoskeletal:  Some arthritis pain in neck, and in feet, not very bothersome  Skin: Negative for rash.  Neurological: Negative for syncope, weakness, light-headedness, numbness and headaches.  Psychiatric/Behavioral: Negative for confusion and dysphoric mood. The patient is not nervous/anxious.  Objective:   Physical Exam  Constitutional: Vital signs are normal. She appears well-developed and well-nourished. She is cooperative. Non-toxic appearance. She does not appear ill. No distress.  HENT:  Head: Normocephalic.  Right Ear: Hearing, tympanic membrane, external ear and ear canal normal.  Left Ear: Hearing, tympanic membrane, external ear and ear canal normal.  Nose: Nose normal.  Eyes: Conjunctivae, EOM and lids are normal. Pupils are equal, round, and reactive to light. No foreign bodies found.  Neck: Trachea normal and  normal range of motion. Neck supple. Carotid bruit is not present. No mass and no thyromegaly present.  Cardiovascular: Normal rate, regular rhythm, S1 normal, S2 normal, normal heart sounds and intact distal pulses. Exam reveals no gallop.  No murmur heard.  Pulmonary/Chest: Effort normal and breath sounds normal. No respiratory distress. She has no wheezes. She has no rhonchi. She has no rales.  Abdominal: Soft. Normal appearance and bowel sounds are normal. She exhibits no distension, no fluid wave, no abdominal bruit and no mass. There is no hepatosplenomegaly. There is no tenderness. There is no rebound, no guarding and no CVA tenderness. No hernia.  Genitourinary: Vagina normal and uterus normal. No breast swelling, tenderness, discharge or bleeding. Pelvic exam was performed with patient supine. There is no rash, tenderness or lesion on the right labia. There is no rash, tenderness or lesion on the left labia. Uterus is not enlarged and not tender. Cervix enot examined. Right adnexum displays no mass, no tenderness and no fullness. Left adnexum displays no mass, no tenderness and no fullness.  Lymphadenopathy:  She has no cervical adenopathy.  She has no axillary adenopathy.  Neurological: She is alert. She has normal strength. No cranial nerve deficit or sensory deficit.  Skin: Skin is warm, dry and intact. No rash noted.  Psychiatric: Her speech is normal and behavior is normal. Judgment normal. Her mood appears not anxious. Cognition and memory are normal. She does not exhibit a depressed mood.  Assessment & Plan:   The patient's preventative maintenance and recommended screening tests for an annual wellness exam were reviewed in full today.  Brought up to date unless services declined.  Counselled on the importance of  diet, exercise, and its role in overall health and mortality.  The patient's FH and SH was reviewed, including their home life, tobacco status, and drug and alcohol status.    Vaccines:Td given today. Nonsmoker.  DVE/PAP: DVE yearly, pap nml 2013 on q3 year schedule.  Mammo: Nml 04/2013 DEXA: No family history of osteoporosis, low risk.  Colon: No colonoscopy.. IFOB neg 2014, repeat yearly

## 2014-04-18 NOTE — Progress Notes (Signed)
Pre visit review using our clinic review tool, if applicable. No additional management support is needed unless otherwise documented below in the visit note. 

## 2014-04-18 NOTE — Assessment & Plan Note (Signed)
Using Ambien prn.

## 2014-04-18 NOTE — Patient Instructions (Signed)
Call to schedule mammogram on your own.  Stop at lab on way out to pick up stool test.

## 2014-04-18 NOTE — Assessment & Plan Note (Signed)
Stable on vesicare

## 2014-04-22 ENCOUNTER — Other Ambulatory Visit (INDEPENDENT_AMBULATORY_CARE_PROVIDER_SITE_OTHER): Payer: BC Managed Care – PPO

## 2014-04-22 ENCOUNTER — Encounter: Payer: Self-pay | Admitting: *Deleted

## 2014-04-22 DIAGNOSIS — Z1212 Encounter for screening for malignant neoplasm of rectum: Secondary | ICD-10-CM

## 2014-04-22 LAB — FECAL OCCULT BLOOD, IMMUNOCHEMICAL: Fecal Occult Bld: NEGATIVE

## 2014-05-02 ENCOUNTER — Other Ambulatory Visit: Payer: Self-pay | Admitting: *Deleted

## 2014-05-02 MED ORDER — SERTRALINE HCL 50 MG PO TABS: ORAL_TABLET | ORAL | Status: DC

## 2014-05-02 MED ORDER — ZOLPIDEM TARTRATE 10 MG PO TABS: 10.0000 mg | ORAL_TABLET | Freq: Every evening | ORAL | Status: DC | PRN

## 2014-05-02 NOTE — Telephone Encounter (Signed)
Ambien prescription printed and signed by Dr. Dayton MartesAron.  Faxed to [facility] [phone].

## 2014-05-23 ENCOUNTER — Ambulatory Visit: Payer: Self-pay | Admitting: Family Medicine

## 2014-05-26 ENCOUNTER — Encounter: Payer: Self-pay | Admitting: Family Medicine

## 2014-07-07 ENCOUNTER — Other Ambulatory Visit: Payer: Self-pay | Admitting: Family Medicine

## 2014-07-21 ENCOUNTER — Other Ambulatory Visit: Payer: Self-pay | Admitting: *Deleted

## 2014-07-21 NOTE — Telephone Encounter (Signed)
Last office visit 04/18/2014.  Last refilled 05/02/2014 for #90 with no refills.  Ok to refill?  Please print Rx so I can fax to [facility].

## 2014-07-22 MED ORDER — ZOLPIDEM TARTRATE 10 MG PO TABS: 10.0000 mg | ORAL_TABLET | Freq: Every evening | ORAL | Status: DC | PRN

## 2014-07-22 NOTE — Telephone Encounter (Signed)
Rx faxed to [facility] [phone].

## 2014-10-04 ENCOUNTER — Other Ambulatory Visit: Payer: Self-pay | Admitting: Family Medicine

## 2014-10-20 ENCOUNTER — Other Ambulatory Visit: Payer: Self-pay | Admitting: *Deleted

## 2014-10-20 MED ORDER — ZOLPIDEM TARTRATE 10 MG PO TABS: 10.0000 mg | ORAL_TABLET | Freq: Every evening | ORAL | Status: DC | PRN

## 2014-10-20 NOTE — Telephone Encounter (Signed)
#  90 last written on 07/22/14. Pt's last appt was a cpx on 04/18/14. Ok to refill?

## 2014-10-21 NOTE — Telephone Encounter (Signed)
Rx faxed to [facility] [phone].

## 2014-11-26 ENCOUNTER — Other Ambulatory Visit: Payer: Self-pay | Admitting: Family Medicine

## 2015-03-10 ENCOUNTER — Other Ambulatory Visit: Payer: Self-pay | Admitting: Family Medicine

## 2015-03-10 NOTE — Telephone Encounter (Signed)
Ambien Rx faxed to [facility] at [phone].

## 2015-03-10 NOTE — Telephone Encounter (Signed)
Last office visit 04/19/2014.  Ambien last refilled 10/20/2014 for #90 with no refills.  Please print Rx to fax in to Mail Order.

## 2015-05-21 ENCOUNTER — Encounter: Payer: Self-pay | Admitting: Family Medicine

## 2015-05-21 ENCOUNTER — Other Ambulatory Visit (HOSPITAL_COMMUNITY)
Admission: RE | Admit: 2015-05-21 | Discharge: 2015-05-21 | Disposition: A | Discharge status: Home / Self Care | Payer: BLUE CROSS/BLUE SHIELD | Source: Ambulatory Visit | Attending: Family Medicine | Admitting: Family Medicine

## 2015-05-21 ENCOUNTER — Ambulatory Visit (INDEPENDENT_AMBULATORY_CARE_PROVIDER_SITE_OTHER): Payer: BLUE CROSS/BLUE SHIELD | Admitting: Family Medicine

## 2015-05-21 VITALS — BP 120/70 | HR 66 | Temp 98.4°F | Ht 66.0 in | Wt 172.0 lb

## 2015-05-21 DIAGNOSIS — Z1151 Encounter for screening for human papillomavirus (HPV): Secondary | ICD-10-CM | POA: Diagnosis present

## 2015-05-21 DIAGNOSIS — Z0000 Encounter for general adult medical examination without abnormal findings: Secondary | ICD-10-CM

## 2015-05-21 DIAGNOSIS — Z01419 Encounter for gynecological examination (general) (routine) without abnormal findings: Secondary | ICD-10-CM | POA: Diagnosis not present

## 2015-05-21 DIAGNOSIS — Z1211 Encounter for screening for malignant neoplasm of colon: Secondary | ICD-10-CM

## 2015-05-21 NOTE — Progress Notes (Signed)
The patient is here for annual wellness exam and preventative care.   Depression, stable control on zoloft. No SI, no HI. No SE.   Exercising daily, but poor eating habits. Working on eating less.    Wt Readings from Last 3 Encounters:  05/21/15 172 lb (78.019 kg)  04/18/14 170 lb 12 oz (77.452 kg)  04/08/13 167 lb 8 oz (75.978 kg)   Insomnia chronic: stable control on ambien 5 mg at a time, using nightly but at 1/2 dose.   Overactive bladder stable on vesicare. Needs refill.   Due for lab eval. RX given to take to work for chol, CMET and HCV  Review of Systems  Constitutional: Negative for fever, fatigue and unexpected weight change.  HENT: Negative for ear pain, congestion, sore throat, sneezing, trouble swallowing and sinus pressure.  Eyes: Negative for pain and itching.  Respiratory: Negative for cough, shortness of breath and wheezing.  Cardiovascular: Negative for chest pain, palpitations and leg swelling.  Gastrointestinal: Negative for nausea, abdominal pain, diarrhea, constipation and blood in stool.  Genitourinary: Negative for dysuria, hematuria, vaginal discharge, difficulty urinating and menstrual problem.  Musculoskeletal:  Some arthritis pain in neck, and in feet, not very bothersome  Skin: Negative for rash.  Neurological: Negative for syncope, weakness, light-headedness, numbness and headaches.  Psychiatric/Behavioral: Negative for confusion and dysphoric mood. The patient is not nervous/anxious.  Objective:   Physical Exam  Constitutional: Vital signs are normal. She appears well-developed and well-nourished. She is cooperative. Non-toxic appearance. She does not appear ill. No distress.  HENT:  Head: Normocephalic.  Right Ear: Hearing, tympanic membrane, external ear and ear canal normal.  Left Ear: Hearing, tympanic membrane, external ear and ear canal normal.  Nose: Nose normal.  Eyes: Conjunctivae, EOM and lids are normal. Pupils are  equal, round, and reactive to light. No foreign bodies found.  Neck: Trachea normal and normal range of motion. Neck supple. Carotid bruit is not present. No mass and no thyromegaly present.  Cardiovascular: Normal rate, regular rhythm, S1 normal, S2 normal, normal heart sounds and intact distal pulses. Exam reveals no gallop.  No murmur heard.  Pulmonary/Chest: Effort normal and breath sounds normal. No respiratory distress. She has no wheezes. She has no rhonchi. She has no rales.  Abdominal: Soft. Normal appearance and bowel sounds are normal. She exhibits no distension, no fluid wave, no abdominal bruit and no mass. There is no hepatosplenomegaly. There is no tenderness. There is no rebound, no guarding and no CVA tenderness. No hernia.  Genitourinary: Vagina normal and uterus normal. No breast swelling, tenderness, discharge or bleeding. Pelvic exam was performed with patient supine. There is no rash, tenderness or lesion on the right labia. There is no rash, tenderness or lesion on the left labia. Uterus is not enlarged and not tender. Cervix enot examined. Right adnexum displays no mass, no tenderness and no fullness. Left adnexum displays no mass, no tenderness and no fullness.  Lymphadenopathy:  She has no cervical adenopathy.  She has no axillary adenopathy.  Neurological: She is alert. She has normal strength. No cranial nerve deficit or sensory deficit.  Skin: Skin is warm, dry and intact. No rash noted.  Psychiatric: Her speech is normal and behavior is normal. Judgment normal. Her mood appears not anxious. Cognition and memory are normal. She does not exhibit a depressed mood.  Assessment & Plan:   The patient's preventative maintenance and recommended screening tests for an annual wellness exam were reviewed in full today.  Brought up to date unless services declined.  Counselled on the importance of diet, exercise, and its role in overall health and mortality.  The  patient's FH and SH was reviewed, including their home life, tobacco status, and drug and alcohol status.   Vaccines:Td uptodate Nonsmoker.  DVE/PAP: DVE yearly, pap nml 2013 on q3 year schedule.   Denies need for STD testing. Mammo: Nml 05/2014, plan q2 years. DEXA: No family history of osteoporosis, low risk.  Colon: No colonoscopy.. IFOB neg 2015, repeat yearly  HCV screen: ordered

## 2015-05-21 NOTE — Patient Instructions (Addendum)
Stop at lab on way out for IFOB testing.  Work on [employer], regular exercise and weight loss.

## 2015-05-21 NOTE — Addendum Note (Signed)
Addended byKerby Nora E on: 05/21/2015 01:19 PM   Modules accepted: SmartSet

## 2015-05-21 NOTE — Addendum Note (Signed)
Addended by: Desmond Dike on: 05/21/2015 10:51 AM   Modules accepted: Orders

## 2015-05-21 NOTE — Progress Notes (Signed)
Pre visit review using our clinic review tool, if applicable. No additional management support is needed unless otherwise documented below in the visit note. 

## 2015-05-22 ENCOUNTER — Encounter: Payer: Self-pay | Admitting: *Deleted

## 2015-05-22 LAB — CYTOLOGY - PAP

## 2015-05-25 ENCOUNTER — Other Ambulatory Visit (INDEPENDENT_AMBULATORY_CARE_PROVIDER_SITE_OTHER): Payer: BLUE CROSS/BLUE SHIELD

## 2015-05-25 DIAGNOSIS — Z1211 Encounter for screening for malignant neoplasm of colon: Secondary | ICD-10-CM | POA: Diagnosis not present

## 2015-05-25 LAB — FECAL OCCULT BLOOD, IMMUNOCHEMICAL: FECAL OCCULT BLD: NEGATIVE

## 2015-05-26 ENCOUNTER — Encounter: Payer: Self-pay | Admitting: *Deleted

## 2015-06-23 ENCOUNTER — Other Ambulatory Visit: Payer: Self-pay | Admitting: Family Medicine

## 2015-06-23 ENCOUNTER — Telehealth: Payer: Self-pay | Admitting: Family Medicine

## 2015-06-23 NOTE — Telephone Encounter (Signed)
Last office visit 05/21/2015.  Last refilled 03/10/2015 for #90 with no refills.  Ok to refill?  Please print to fax to Mail Order.

## 2015-06-23 NOTE — Telephone Encounter (Signed)
Pt returned your call.  

## 2015-06-23 NOTE — Telephone Encounter (Signed)
Rx faxed to [facility] [phone].

## 2015-06-23 NOTE — Telephone Encounter (Signed)
Ms. Loge notified labs results received from [facility] were normal per Dr. Ermalene Searing.

## 2015-09-28 ENCOUNTER — Other Ambulatory Visit: Payer: Self-pay | Admitting: Family Medicine

## 2015-09-28 NOTE — Telephone Encounter (Signed)
Last office visit 05/21/2015.  Last refilled 06/23/2015 for #90 with no refills.  Ok to refill?

## 2015-09-29 MED ORDER — ZOLPIDEM TARTRATE 10 MG PO TABS: ORAL_TABLET | ORAL | Status: DC

## 2015-09-29 NOTE — Telephone Encounter (Signed)
Zolpidem printed to fax into [facility].

## 2015-09-29 NOTE — Telephone Encounter (Signed)
Ambien Rx faxed into [facility] [phone].

## 2015-09-29 NOTE — Addendum Note (Signed)
Addended by: Damita LackLORING, Rowen Wilmer S on: 09/29/2015 01:16 PM   Modules accepted: Orders

## 2016-02-16 ENCOUNTER — Other Ambulatory Visit: Payer: Self-pay | Admitting: Family Medicine

## 2016-02-16 MED ORDER — ZOLPIDEM TARTRATE 10 MG PO TABS: ORAL_TABLET | ORAL | Status: DC

## 2016-02-16 NOTE — Telephone Encounter (Addendum)
Zolpidem faxed to [facility] [phone].

## 2016-02-16 NOTE — Telephone Encounter (Signed)
Last office visit 05/21/2015.  Last refilled 09/29/2015 for #90 with no refills.  Ok to refill?  Please print Rx to fax to mail order pharmacy.

## 2016-02-16 NOTE — Addendum Note (Signed)
Addended by: Damita LackLORING, Jeromie Gainor S on: 02/16/2016 11:02 AM   Modules accepted: Orders

## 2016-04-29 ENCOUNTER — Other Ambulatory Visit: Payer: Self-pay | Admitting: Family Medicine

## 2016-05-16 ENCOUNTER — Emergency Department
Admission: EM | Admit: 2016-05-16 | Discharge: 2016-05-16 | Disposition: A | Discharge status: Home / Self Care | Payer: BLUE CROSS/BLUE SHIELD | Attending: Emergency Medicine | Admitting: Emergency Medicine

## 2016-05-16 ENCOUNTER — Ambulatory Visit (INDEPENDENT_AMBULATORY_CARE_PROVIDER_SITE_OTHER): Payer: BLUE CROSS/BLUE SHIELD | Admitting: Family Medicine

## 2016-05-16 ENCOUNTER — Encounter: Payer: Self-pay | Admitting: Family Medicine

## 2016-05-16 ENCOUNTER — Encounter: Payer: Self-pay | Admitting: Emergency Medicine

## 2016-05-16 VITALS — BP 122/64 | HR 82 | Temp 98.8°F | Wt 178.5 lb

## 2016-05-16 DIAGNOSIS — Y939 Activity, unspecified: Secondary | ICD-10-CM | POA: Insufficient documentation

## 2016-05-16 DIAGNOSIS — S61451A Open bite of right hand, initial encounter: Secondary | ICD-10-CM | POA: Insufficient documentation

## 2016-05-16 DIAGNOSIS — Z87891 Personal history of nicotine dependence: Secondary | ICD-10-CM | POA: Diagnosis not present

## 2016-05-16 DIAGNOSIS — W5501XA Bitten by cat, initial encounter: Secondary | ICD-10-CM | POA: Insufficient documentation

## 2016-05-16 DIAGNOSIS — Y999 Unspecified external cause status: Secondary | ICD-10-CM | POA: Insufficient documentation

## 2016-05-16 DIAGNOSIS — M25531 Pain in right wrist: Secondary | ICD-10-CM

## 2016-05-16 DIAGNOSIS — Y929 Unspecified place or not applicable: Secondary | ICD-10-CM | POA: Insufficient documentation

## 2016-05-16 DIAGNOSIS — M25431 Effusion, right wrist: Secondary | ICD-10-CM

## 2016-05-16 DIAGNOSIS — Z79899 Other long term (current) drug therapy: Secondary | ICD-10-CM | POA: Insufficient documentation

## 2016-05-16 HISTORY — DX: Overactive bladder: N32.81

## 2016-05-16 HISTORY — DX: Major depressive disorder, single episode, unspecified: F32.9

## 2016-05-16 HISTORY — DX: Depression, unspecified: F32.A

## 2016-05-16 LAB — BASIC METABOLIC PANEL
ANION GAP: 5 (ref 5–15)
BUN: 19 mg/dL (ref 6–20)
CO2: 28 mmol/L (ref 22–32)
Calcium: 9 mg/dL (ref 8.9–10.3)
Chloride: 106 mmol/L (ref 101–111)
Creatinine, Ser: 0.54 mg/dL (ref 0.44–1.00)
GLUCOSE: 99 mg/dL (ref 65–99)
POTASSIUM: 3.8 mmol/L (ref 3.5–5.1)
Sodium: 139 mmol/L (ref 135–145)

## 2016-05-16 LAB — CBC WITH DIFFERENTIAL/PLATELET
BASOS ABS: 0.1 10*3/uL (ref 0–0.1)
BASOS PCT: 1 %
Eosinophils Absolute: 0.1 10*3/uL (ref 0–0.7)
Eosinophils Relative: 2 %
HEMATOCRIT: 35.1 % (ref 35.0–47.0)
HEMOGLOBIN: 11.9 g/dL — AB (ref 12.0–16.0)
LYMPHS PCT: 17 %
Lymphs Abs: 1.5 10*3/uL (ref 1.0–3.6)
MCH: 28.7 pg (ref 26.0–34.0)
MCHC: 33.8 g/dL (ref 32.0–36.0)
MCV: 85 fL (ref 80.0–100.0)
MONO ABS: 0.9 10*3/uL (ref 0.2–0.9)
MONOS PCT: 11 %
NEUTROS ABS: 6 10*3/uL (ref 1.4–6.5)
NEUTROS PCT: 69 %
Platelets: 312 10*3/uL (ref 150–440)
RBC: 4.13 MIL/uL (ref 3.80–5.20)
RDW: 14.5 % (ref 11.5–14.5)
WBC: 8.7 10*3/uL (ref 3.6–11.0)

## 2016-05-16 MED ORDER — KETOROLAC TROMETHAMINE 30 MG/ML IJ SOLN
30.0000 mg | Freq: Once | INTRAMUSCULAR | Status: AC
  Administered 2016-05-16: 30 mg via INTRAVENOUS
  Filled 2016-05-16: qty 1

## 2016-05-16 MED ORDER — AMOXICILLIN-POT CLAVULANATE 875-125 MG PO TABS: 1.0000 | ORAL_TABLET | Freq: Two times a day (BID) | ORAL | 0 refills | Status: DC

## 2016-05-16 MED ORDER — AMOXICILLIN-POT CLAVULANATE 875-125 MG PO TABS: 1.0000 | ORAL_TABLET | Freq: Two times a day (BID) | ORAL | 0 refills | Status: AC

## 2016-05-16 MED ORDER — AMOXICILLIN-POT CLAVULANATE 875-125 MG PO TABS
1.0000 | ORAL_TABLET | Freq: Once | ORAL | Status: AC
  Administered 2016-05-16: 1 via ORAL
  Filled 2016-05-16: qty 1

## 2016-05-16 MED ORDER — SODIUM CHLORIDE 0.9 % IV BOLUS (SEPSIS)
500.0000 mL | Freq: Once | INTRAVENOUS | Status: AC
  Administered 2016-05-16: 500 mL via INTRAVENOUS

## 2016-05-16 MED ORDER — CLINDAMYCIN PHOSPHATE 600 MG/50ML IV SOLN
600.0000 mg | Freq: Once | INTRAVENOUS | Status: AC
Start: 2016-05-16 — End: 2016-05-16
  Administered 2016-05-16: 600 mg via INTRAVENOUS
  Filled 2016-05-16: qty 50

## 2016-05-16 NOTE — ED Notes (Signed)
Pt is not agreeable to the arm sling.  Pt states she has been elevating the left hand and will continue to elevate it, but refuses the arm sling.

## 2016-05-16 NOTE — Discharge Instructions (Signed)
Keep right upper extremity elevated and continue antibiotics until evaluation by hand surgeon. Call Dr. Martha ClanKrasinski office at 8:30 in the morning for appointment time. Be sure to advised follow-up from the emergency room

## 2016-05-16 NOTE — Patient Instructions (Signed)
Shirlee LimerickMarion will call about your referral. Start the augmentin today.  Take care.  Glad to see you.

## 2016-05-16 NOTE — Assessment & Plan Note (Signed)
D/w pt.  The initial plan was for augmentin PO and her to be seen by ortho/hand clinic today.   Upon contact from ortho clinic, advice was to have patient go to ER.  Staff called ahead to [facility].  App ortho input.   Routed to PCP as FYI.

## 2016-05-16 NOTE — ED Notes (Signed)
Pt states that bite occurred while she was [facility]ering [facility] and the incident was reported as required.

## 2016-05-16 NOTE — Progress Notes (Signed)
Pre visit review using our clinic review tool, if applicable. No additional management support is needed unless otherwise documented below in the visit note. 

## 2016-05-16 NOTE — Progress Notes (Signed)
Cat bite.  25 hours ago.  Cat was brought in 5 days ago to the animal shelter.  She volunteers at the shelter.  Cat was a stray.  Cat is in quarantine for now.  No known hx for the cat.  Cat is still acting normally.  She has had rabies pre exposure proph done years ago.    No FCNAVD.  Feels well except for the R hand.  Local swelling.  Pain controlled with ibuprofen.    tdap 2015.    Meds, vitals, and allergies reviewed.   ROS: Per HPI unless specifically indicated in ROS section   nad Normal ROM at the R elbow and wrist and hand but diffuse swelling at the wrist with 4 epithelial disruptions noted on dorsum of hand/wrist. No purulent discharge but diffuse redness, extends up the flexor side of forearm.  Sensation intact distally.

## 2016-05-16 NOTE — ED Triage Notes (Signed)
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  Swelling started last night and got worse overnight and this morning.  Pt denies fever.

## 2016-05-16 NOTE — ED Provider Notes (Signed)
[facility] Emergency Department Provider Note   ____________________________________________   First MD Initiated Contact with Patient 05/16/16 1359     (approximate)  I have reviewed the triage vital signs and the nursing notes.   HISTORY  Chief Complaint Animal Bite    HPI Margaret Short is a 60 y.o. female patient is pain edema and erythema to the right dorsal aspect of her hands secondary to a bite by a stray cat. Patient does volunteer working and was [employer] yesterday and was bitten by cat approximately 11 AM. Patient stated this swelling started last night and worsened this morning. Patient denies any fever associated this complaint. Patient is right-hand dominant. Patient is rating her pain as a 1/10. No palliative measures taken for this complaint. Patient is right-hand dominant.Patient state her tetanus shot is up-to-date.   Past Medical History:  Diagnosis Date  . Depression   . Overactive bladder     Patient Active Problem List   Diagnosis Date Noted  . Cat bite 05/16/2016  . Depression with anxiety 07/08/2011  . 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 INSOMNIA, CHRONIC 11/09/2010  . OVERACTIVE BLADDER 11/09/2010    Past Surgical History:  Procedure Laterality Date  . CORNEAL TRANSPLANT Right   . Keraplasty  1996  . LAMELLAR KERATOPLASTY  1985   partial    Prior to Admission medications   Medication Sig Start Date End Date Taking? Authorizing Provider  amoxicillin-clavulanate (AUGMENTIN) 875-125 MG tablet Take 1 tablet by mouth 2 (two) times daily. 05/16/16   Joaquim Nam, MD  amoxicillin-clavulanate (AUGMENTIN) 875-125 MG tablet Take 1 tablet by mouth 2 (two) times daily. 05/16/16 05/23/16  Joni Reining, PA-C  amoxicillin-clavulanate (AUGMENTIN) 875-125 MG tablet Take 1 tablet by mouth 2 (two) times daily. 05/16/16 05/23/16  Joni Reining, PA-C  sertraline (ZOLOFT) 50 MG tablet TAKE 1 BY MOUTH DAILY 02/16/16   Amy Michelle Nasuti, MD    VESICARE 5 MG tablet TAKE 1 BY MOUTH DAILY 04/29/16   Amy Michelle Nasuti, MD  zolpidem (AMBIEN) 10 MG tablet TAKE 1 BY MOUTH AT BEDTIME AS NEEDED 02/16/16   Amy Michelle Nasuti, MD    Allergies Review of patient's allergies indicates no known allergies.  Family History  Problem Relation Age of Onset  . Cancer Father     oral cancer due to tobacco use  . Hypertension Mother   . Osteoarthritis Mother   . Healthy Brother   . Healthy Brother   . Healthy Brother   . Multiple sclerosis Paternal Aunt     Social History Social History  Substance Use Topics  . Smoking status: Former Smoker    Quit date: 1981  . Smokeless tobacco: Never Used  . Alcohol use No    Review of Systems Constitutional: No fever/chills Eyes: No visual changes. ENT: No sore throat. Cardiovascular: Denies chest pain. Respiratory: Denies shortness of breath. Gastrointestinal: No abdominal pain.  No nausea, no vomiting.  No diarrhea.  No constipation. Genitourinary: Negative for dysuria. Musculoskeletal: Negative for back pain. Skin: Negative for rash. Multiple bite marks on edematous erythematous dorsal aspect of right hand. Neurological: Negative for headaches, focal weakness or numbness. Psychiatric:Depression with anxiety  ____________________________________________   PHYSICAL EXAM:  VITAL SIGNS: ED Triage Vitals  Enc Vitals Group     BP 05/16/16 1354 (!) 156/70     Pulse Rate 05/16/16 1354 80     Resp 05/16/16 1354 18     Temp 05/16/16 1354 98.3 F (  36.8 C)     Temp Source 05/16/16 1354 Oral     SpO2 05/16/16 1354 98 %     Weight 05/16/16 1354 175 lb (79.4 kg)     Height 05/16/16 1354 5\' 8"  (1.727 m)     Head Circumference --      Peak Flow --      Pain Score 05/16/16 1403 1     Pain Loc --      Pain Edu? --      Excl. in GC? --     Constitutional: Alert and oriented. Well appearing and in no acute distress. Eyes: Conjunctivae are normal. PERRL. EOMI. Head: Atraumatic. Nose: No  congestion/rhinnorhea. Mouth/Throat: Mucous membranes are moist.  Oropharynx non-erythematous. Neck: No stridor.  No cervical spine tenderness to palpation. Hematological/Lymphatic/Immunilogical: No cervical lymphadenopathy. Cardiovascular: Normal rate, regular rhythm. Grossly normal heart sounds.  Good peripheral circulation. Respiratory: Normal respiratory effort.  No retractions. Lungs CTAB. Gastrointestinal: Soft and nontender. No distention. No abdominal bruits. No CVA tenderness. Musculoskeletal: No lower extremity tenderness nor edema.  No joint effusions. Neurologic:  Normal speech and language. No gross focal neurologic deficits are appreciated. No gait instability. Skin:  Edematous and erythematous dorsal right hand. Multiple bite marks. Psychiatric: Mood and affect are normal. Speech and behavior are normal.  ____________________________________________   LABS (all labs ordered are listed, but only abnormal results are displayed)  Labs Reviewed  CBC WITH DIFFERENTIAL/PLATELET - Abnormal; Notable for the following:       Result Value   Hemoglobin 11.9 (*)    All other components within normal limits  CULTURE, BLOOD (ROUTINE X 2)  CULTURE, BLOOD (ROUTINE X 2)  BASIC METABOLIC PANEL   ____________________________________________  EKG  ____________________________________________  RADIOLOGY   ____________________________________________   PROCEDURES  Procedure(s) performed: None  Procedures  Critical Care performed: No  ____________________________________________   INITIAL IMPRESSION / ASSESSMENT AND PLAN / ED COURSE  Pertinent labs & imaging results that were available during my care of the patient were reviewed by me and considered in my medical decision making (see chart for details). Cellulitis right hand secondary to cat bite. Patient given discharge care instructions. Patient will start Augmentin as directed. Patient advised to follow-up with  orthopedics by calling for an appointment in the morning. Return by ER for condition worsens.   Clinical Course     ____________________________________________   FINAL CLINICAL IMPRESSION(S) / ED DIAGNOSES  Final diagnoses:  Cat bite, initial encounter      NEW MEDICATIONS STARTED DURING THIS VISIT:  New Prescriptions   AMOXICILLIN-CLAVULANATE (AUGMENTIN) 875-125 MG TABLET    Take 1 tablet by mouth 2 (two) times daily.   AMOXICILLIN-CLAVULANATE (AUGMENTIN) 875-125 MG TABLET    Take 1 tablet by mouth 2 (two) times daily.     Note:  This document was prepared using Dragon voice recognition software and may include unintentional dictation errors.    Joni Reiningonald K Jeni Duling, PA-C 05/16/16 1538    Jene Everyobert Kinner, MD 05/20/16 [phone]

## 2016-05-17 ENCOUNTER — Telehealth: Payer: Self-pay | Admitting: Family Medicine

## 2016-05-17 NOTE — Telephone Encounter (Signed)
Noted. Thanks.

## 2016-05-17 NOTE — Telephone Encounter (Signed)
Patient called to request us to help her get a Hand Surgeon appt. She [facility] yesterday where they gave her IV Antibiotics of Clindamycin and an Nsaid for about 3 hours. She filled the Augmentin RX you gave her. The ER told her to call a Hand Surgeon to be seen. She called [facility] and couldn't be seen for two weeks so she called us back to help her. I called [facility] Dr Ronnell Freshwaterhompsons office requesting an appt for her. I faxed them your note and the ER Note. Their office said they may call you to speak to you directly.

## 2016-05-18 MED ORDER — OXYCODONE-ACETAMINOPHEN 5-325 MG PO TABS
ORAL_TABLET | ORAL | Status: AC
  Filled 2016-05-18: qty 1

## 2016-05-21 LAB — CULTURE, BLOOD (ROUTINE X 2): CULTURE: NO GROWTH

## 2016-05-24 ENCOUNTER — Encounter: Payer: Self-pay | Admitting: Family Medicine

## 2016-05-24 ENCOUNTER — Ambulatory Visit (INDEPENDENT_AMBULATORY_CARE_PROVIDER_SITE_OTHER): Payer: BLUE CROSS/BLUE SHIELD | Admitting: Family Medicine

## 2016-05-24 VITALS — BP 124/82 | Temp 98.4°F | Ht 68.0 in | Wt 176.0 lb

## 2016-05-24 DIAGNOSIS — G4700 Insomnia, unspecified: Secondary | ICD-10-CM | POA: Diagnosis not present

## 2016-05-24 DIAGNOSIS — N318 Other neuromuscular dysfunction of bladder: Secondary | ICD-10-CM

## 2016-05-24 DIAGNOSIS — Z0000 Encounter for general adult medical examination without abnormal findings: Secondary | ICD-10-CM

## 2016-05-24 DIAGNOSIS — Z1211 Encounter for screening for malignant neoplasm of colon: Secondary | ICD-10-CM

## 2016-05-24 DIAGNOSIS — F324 Major depressive disorder, single episode, in partial remission: Secondary | ICD-10-CM | POA: Diagnosis not present

## 2016-05-24 NOTE — Progress Notes (Signed)
The patient is here for annual wellness exam and preventative care.   Depression, stable control on zoloft. No SI, no HI. No SE.   Exercising daily, but poor eating habits. Working on eating less.    BP Readings from Last 3 Encounters:  05/24/16 124/82  05/16/16 (!) 153/68  05/16/16 122/64   Wt Readings from Last 3 Encounters:  05/24/16 176 lb (79.8 kg)  05/16/16 175 lb (79.4 kg)  05/16/16 178 lb 8 oz (81 kg)   Insomnia chronic: stable control on ambien 5 mg at a time, using nightly..   Overactive bladder stable on vesicare. Needs refill.   Due for lab eval. , not interested in evaluating chol this year.  Social History /Family History/Past Medical History reviewed and updated if needed.   Review of Systems  Constitutional: Negative for fever, fatigue and unexpected weight change.  HENT: Negative for ear pain, congestion, sore throat, sneezing, trouble swallowing and sinus pressure.  Eyes: Negative for pain and itching.  Respiratory: Negative for cough, shortness of breath and wheezing.  Cardiovascular: Negative for chest pain, palpitations and leg swelling.  Gastrointestinal: Negative for nausea, abdominal pain, diarrhea, constipation and blood in stool.  Genitourinary: Negative for dysuria, hematuria, vaginal discharge, difficulty urinating and menstrual problem.  Musculoskeletal:  Some arthritis pain in neck, and in feet, not very bothersome  Skin: Negative for rash.  Neurological: Negative for syncope, weakness, light-headedness, numbness and headaches.  Psychiatric/Behavioral: Negative for confusion and dysphoric mood. The patient is not nervous/anxious.  Objective:   Physical Exam  Constitutional: Vital signs are normal. She appears well-developed and well-nourished. She is cooperative. Non-toxic appearance. She does not appear ill. No distress.  HENT:  Head: Normocephalic.  Right Ear: Hearing, tympanic membrane, external ear and ear canal  normal.  Left Ear: Hearing, tympanic membrane, external ear and ear canal normal.  Nose: Nose normal.  Eyes: Conjunctivae, EOM and lids are normal. Pupils are equal, round, and reactive to light. No foreign bodies found.  Neck: Trachea normal and normal range of motion. Neck supple. Carotid bruit is not present. No mass and no thyromegaly present.  Cardiovascular: Normal rate, regular rhythm, S1 normal, S2 normal, normal heart sounds and intact distal pulses. Exam reveals no gallop.  No murmur heard.  Pulmonary/Chest: Effort normal and breath sounds normal. No respiratory distress. She has no wheezes. She has no rhonchi. She has no rales.  Abdominal: Soft. Normal appearance and bowel sounds are normal. She exhibits no distension, no fluid wave, no abdominal bruit and no mass. There is no hepatosplenomegaly. There is no tenderness. There is no rebound, no guarding and no CVA tenderness. No hernia.  Genitourinary:not indicated, pt not interested. Lymphadenopathy:  She has no cervical adenopathy.  She has no axillary adenopathy.  Neurological: She is alert. She has normal strength. No cranial nerve deficit or sensory deficit.  Skin: Skin is warm, dry and intact. No rash noted.  Psychiatric: Her speech is normal and behavior is normal. Judgment normal. Her mood appears not anxious. Cognition and memory are normal. She does not exhibit a depressed mood.  Assessment & Plan:   The patient's preventative maintenance and recommended screening tests for an annual wellness exam were reviewed in full today.  Brought up to date unless services declined.  Counselled on the importance of diet, exercise, and its role in overall health and mortality.  The patient's FH and SH was reviewed, including their home life, tobacco status, and drug and alcohol status.   Vaccines:Td uptodate  Nonsmoker.  DVE/PAP: DVE not indicated, pap nml 2013, 2016 nml , neg co-testing, repeat in 5 years.   Denies  need for STD testing. Mammo: Nml 05/2014, plan q2 years. DEXA: No family history of osteoporosis, low risk.  Colon: No colonoscopy.. IFOB neg 2016, repeat yearly  HCV screen: neg

## 2016-05-24 NOTE — Assessment & Plan Note (Signed)
Stable control. 

## 2016-05-24 NOTE — Assessment & Plan Note (Signed)
Well controlled on current dose on zoloft.

## 2016-05-24 NOTE — Patient Instructions (Addendum)
Schedule mammogram on your own.  Stop at lab on way for stool cards.  Get flu vaccine when you are ready.  Work on [employer] eating and regular exercise.

## 2016-05-24 NOTE — Assessment & Plan Note (Signed)
Using ambein 5 mg at night

## 2016-05-25 ENCOUNTER — Other Ambulatory Visit: Payer: Self-pay | Admitting: Family Medicine

## 2016-05-25 DIAGNOSIS — Z1231 Encounter for screening mammogram for malignant neoplasm of breast: Secondary | ICD-10-CM

## 2016-06-01 ENCOUNTER — Other Ambulatory Visit (INDEPENDENT_AMBULATORY_CARE_PROVIDER_SITE_OTHER): Payer: BLUE CROSS/BLUE SHIELD

## 2016-06-01 ENCOUNTER — Other Ambulatory Visit: Payer: Self-pay | Admitting: Family Medicine

## 2016-06-01 DIAGNOSIS — Z1211 Encounter for screening for malignant neoplasm of colon: Secondary | ICD-10-CM

## 2016-06-01 LAB — FECAL OCCULT BLOOD, IMMUNOCHEMICAL: FECAL OCCULT BLD: NEGATIVE

## 2016-06-01 NOTE — Telephone Encounter (Signed)
Last office visit 05/24/2016.  Last refilled 02/16/2016 for #90 with no refills.  Ok to refill?

## 2016-06-02 NOTE — Telephone Encounter (Signed)
Rx faxed to [facility] at [phone].

## 2016-06-08 ENCOUNTER — Ambulatory Visit
Admission: RE | Admit: 2016-06-08 | Discharge: 2016-06-08 | Disposition: A | Discharge status: Home / Self Care | Payer: BLUE CROSS/BLUE SHIELD | Source: Ambulatory Visit | Attending: Family Medicine | Admitting: Family Medicine

## 2016-06-08 ENCOUNTER — Other Ambulatory Visit: Payer: Self-pay | Admitting: Family Medicine

## 2016-06-08 DIAGNOSIS — Z1231 Encounter for screening mammogram for malignant neoplasm of breast: Secondary | ICD-10-CM | POA: Diagnosis present

## 2016-08-16 ENCOUNTER — Other Ambulatory Visit: Payer: Self-pay

## 2016-08-16 MED ORDER — ZOLPIDEM TARTRATE 10 MG PO TABS
ORAL_TABLET | ORAL | 0 refills | Status: DC
Start: 2016-08-16 — End: 2017-04-06

## 2016-08-16 MED ORDER — SERTRALINE HCL 50 MG PO TABS: ORAL_TABLET | ORAL | 1 refills | Status: DC

## 2016-08-16 MED ORDER — SOLIFENACIN SUCCINATE 5 MG PO TABS: ORAL_TABLET | ORAL | 0 refills | Status: DC

## 2016-08-16 NOTE — Telephone Encounter (Signed)
Pt called to request refills on the following medications.  ambien last filled on 8/24 #90 zoloft 8/23 #90 + 1 Vesicare 7/21 #90   last OV 8/15 OK to refill?

## 2016-08-16 NOTE — Telephone Encounter (Signed)
[facility] at [phone].

## 2016-10-05 ENCOUNTER — Other Ambulatory Visit: Payer: Self-pay | Admitting: Family Medicine

## 2016-10-05 NOTE — Telephone Encounter (Signed)
Patient send refill request through MyChart with this message.  Patient Comment: I would like to try the generic version of oxybutynin extended release in place of vesicare due to changes in insurance coverage, could I get a prescription for the lowest dose? [facility] for [facility] 90 day supply

## 2016-10-07 ENCOUNTER — Encounter: Payer: Self-pay | Admitting: Family Medicine

## 2016-10-07 ENCOUNTER — Other Ambulatory Visit: Payer: Self-pay | Admitting: Family Medicine

## 2016-10-07 MED ORDER — OXYBUTYNIN CHLORIDE ER 5 MG PO TB24: 5.0000 mg | ORAL_TABLET | Freq: Every day | ORAL | 3 refills | Status: DC

## 2016-10-07 MED ORDER — SOLIFENACIN SUCCINATE 5 MG PO TABS: ORAL_TABLET | ORAL | 0 refills | Status: DC

## 2017-01-24 ENCOUNTER — Other Ambulatory Visit: Payer: Self-pay | Admitting: Family Medicine

## 2017-04-06 ENCOUNTER — Other Ambulatory Visit: Payer: Self-pay | Admitting: Family Medicine

## 2017-04-06 NOTE — Telephone Encounter (Signed)
Rx faxed to mail order pharmacy. 

## 2017-04-06 NOTE — Telephone Encounter (Signed)
ZOLOFT last filled on 01/24/17 #90 AMBIEN 08/16/17 #90

## 2017-05-26 ENCOUNTER — Encounter: Payer: Self-pay | Admitting: Family Medicine

## 2017-05-26 ENCOUNTER — Ambulatory Visit (INDEPENDENT_AMBULATORY_CARE_PROVIDER_SITE_OTHER): Payer: BLUE CROSS/BLUE SHIELD | Admitting: Family Medicine

## 2017-05-26 VITALS — BP 108/64 | HR 98 | Temp 97.8°F | Ht 68.0 in | Wt 170.8 lb

## 2017-05-26 DIAGNOSIS — N318 Other neuromuscular dysfunction of bladder: Secondary | ICD-10-CM | POA: Diagnosis not present

## 2017-05-26 DIAGNOSIS — Z0000 Encounter for general adult medical examination without abnormal findings: Secondary | ICD-10-CM | POA: Diagnosis not present

## 2017-05-26 DIAGNOSIS — Z1211 Encounter for screening for malignant neoplasm of colon: Secondary | ICD-10-CM

## 2017-05-26 DIAGNOSIS — G4700 Insomnia, unspecified: Secondary | ICD-10-CM | POA: Diagnosis not present

## 2017-05-26 NOTE — Assessment & Plan Note (Signed)
Using Ambien low dose.. Well controlled.

## 2017-05-26 NOTE — Progress Notes (Signed)
Subjective:    Patient ID: Margaret Short, female    DOB: 27-Jan-1956, 61 y.o.   MRN: [id number]  HPI  The patient is here for annual wellness exam and preventative care.    Diet: weight watchers Exercise: walking some She has lost 10 lbs in last month   Wt Readings from Last 3 Encounters:  05/26/17 170 lb 12.8 oz (77.5 kg)  05/24/16 176 lb (79.8 kg)  05/16/16 175 lb (79.4 kg)   Body mass index is 25.97 kg/m.  Depression: stable control  She has weaned off sertraline. 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  PHQ2: 0  Chronic insomnia well treated with Ambien nightly.  Blood pressure 108/64, pulse 98, temperature 97.8 F (36.6 C), temperature source Oral, height 5\' 8"  (1.727 m), weight 170 lb 12.8 oz (77.5 kg), SpO2 98 %. Social History /Family History/Past Medical History reviewed in detail and updated in EMR if needed.  Review of Systems  Constitutional: Negative for fatigue and fever.  HENT: Negative for ear pain.   Eyes: Negative for pain.  Respiratory: Negative for chest tightness and shortness of breath.   Cardiovascular: Negative for chest pain, palpitations and leg swelling.  Gastrointestinal: Negative for abdominal pain.  Genitourinary: Negative for dysuria.       Objective:   Physical Exam  Constitutional: Vital signs are normal. She appears well-developed and well-nourished. She is cooperative.  Non-toxic appearance. She does not appear ill. No distress.  HENT:  Head: Normocephalic.  Right Ear: Hearing, tympanic membrane, external ear and ear canal normal. Tympanic membrane is not erythematous, not retracted and not bulging.  Left Ear: Hearing, tympanic membrane, external ear and ear canal normal. Tympanic membrane is not erythematous, not retracted and not bulging.  Nose: No mucosal edema or rhinorrhea. Right sinus exhibits no maxillary sinus tenderness and no frontal sinus tenderness. Left sinus exhibits no maxillary  sinus tenderness and no frontal sinus tenderness.  Mouth/Throat: Uvula is midline, oropharynx is clear and moist and mucous membranes are normal.  Eyes: Pupils are equal, round, and reactive to light. Conjunctivae, EOM and lids are normal. Lids are everted and swept, no foreign bodies found.  Neck: Trachea normal and normal range of motion. Neck supple. Carotid bruit is not present. No thyroid mass and no thyromegaly present.  Cardiovascular: Normal rate, regular rhythm, S1 normal, S2 normal, normal heart sounds, intact distal pulses and normal pulses.  Exam reveals no gallop and no friction rub.   No murmur heard. Pulmonary/Chest: Effort normal and breath sounds normal. No tachypnea. No respiratory distress. She has no decreased breath sounds. She has no wheezes. She has no rhonchi. She has no rales.  Abdominal: Soft. Normal appearance and bowel sounds are normal. There is no tenderness.  Neurological: She is alert.  Skin: Skin is warm, dry and intact. No rash noted.  Psychiatric: Her speech is normal and behavior is normal. Judgment and thought content normal. Her mood appears not anxious. Cognition and memory are normal. She does not exhibit a depressed mood.          Assessment & Plan:  The patient's preventative maintenance and recommended screening tests for an annual wellness exam were reviewed in full today. Brought up to date unless services declined.  Counselled on the importance of diet, exercise, and its role in overall health and mortality. The patient's FH and SH was reviewed, including their home life, tobacco status, and drug and alcohol status.  Vaccines: Td uptodate Nonsmoker.  DVE/PAP: DVE not indicated, pap nml 2013, 2016 nml , neg co-testing, repeat in 5 years.  No genital symptoms. Denies need for STD testing. Mammo: Nml 05/2016, plan q2 years. DEXA: No family history of osteoporosis, low risk.  Start age 79 Colon: No colonoscopy.. IFOB neg 2017, repeat  yearly HCV screen: neg

## 2017-05-26 NOTE — Patient Instructions (Signed)
Please stop at the lab for ifob.

## 2017-05-26 NOTE — Assessment & Plan Note (Signed)
Stable using oxybutynin every other day , limiting given SE.

## 2017-06-01 ENCOUNTER — Other Ambulatory Visit (INDEPENDENT_AMBULATORY_CARE_PROVIDER_SITE_OTHER): Payer: BLUE CROSS/BLUE SHIELD

## 2017-06-01 DIAGNOSIS — Z1211 Encounter for screening for malignant neoplasm of colon: Secondary | ICD-10-CM | POA: Diagnosis not present

## 2017-06-01 LAB — FECAL OCCULT BLOOD, IMMUNOCHEMICAL: FECAL OCCULT BLD: NEGATIVE

## 2017-06-07 ENCOUNTER — Other Ambulatory Visit: Payer: Self-pay

## 2017-06-07 DIAGNOSIS — Z13220 Encounter for screening for lipoid disorders: Secondary | ICD-10-CM

## 2017-06-08 LAB — COMPREHENSIVE METABOLIC PANEL
ALK PHOS: 82 IU/L (ref 39–117)
ALT: 16 IU/L (ref 0–32)
AST: 25 IU/L (ref 0–40)
Albumin/Globulin Ratio: 1.5 (ref 1.2–2.2)
Albumin: 4.4 g/dL (ref 3.6–4.8)
BILIRUBIN TOTAL: 0.3 mg/dL (ref 0.0–1.2)
BUN/Creatinine Ratio: 21 (ref 12–28)
BUN: 16 mg/dL (ref 8–27)
CHLORIDE: 103 mmol/L (ref 96–106)
CO2: 23 mmol/L (ref 20–29)
Calcium: 9.5 mg/dL (ref 8.7–10.3)
Creatinine, Ser: 0.75 mg/dL (ref 0.57–1.00)
GFR calc Af Amer: 100 mL/min/{1.73_m2} (ref >59)
GFR calc non Af Amer: 87 mL/min/{1.73_m2} (ref >59)
GLUCOSE: 87 mg/dL (ref 65–99)
Globulin, Total: 2.9 g/dL (ref 1.5–4.5)
Potassium: 4.6 mmol/L (ref 3.5–5.2)
Sodium: 140 mmol/L (ref 134–144)
Total Protein: 7.3 g/dL (ref 6.0–8.5)

## 2017-06-08 LAB — LIPID PANEL
CHOLESTEROL TOTAL: 186 mg/dL (ref 100–199)
Chol/HDL Ratio: 2.4 ratio (ref 0.0–4.4)
HDL: 77 mg/dL (ref >39)
LDL Calculated: 95 mg/dL (ref 0–99)
Triglycerides: 70 mg/dL (ref 0–149)
VLDL CHOLESTEROL CAL: 14 mg/dL (ref 5–40)

## 2017-08-17 ENCOUNTER — Other Ambulatory Visit: Payer: Self-pay | Admitting: Family Medicine

## 2017-08-17 NOTE — Telephone Encounter (Signed)
[facility]pidem called into [facility] [place] - [address] AT [address]

## 2017-08-17 NOTE — Telephone Encounter (Signed)
Last office visit 05/26/2017.  Last refilled 04/06/2017 for #90 with no refills.  Ok to refill?

## 2017-12-11 ENCOUNTER — Other Ambulatory Visit: Payer: Self-pay | Admitting: Family Medicine

## 2017-12-11 NOTE — Telephone Encounter (Signed)
Last office visit 05/26/2017.  Last refilled 08/17/2017 for #90 with no refills.  Ok to refill?

## 2018-01-25 ENCOUNTER — Encounter: Payer: Self-pay | Admitting: Family Medicine

## 2018-01-25 ENCOUNTER — Ambulatory Visit: Payer: BLUE CROSS/BLUE SHIELD | Admitting: Family Medicine

## 2018-01-25 ENCOUNTER — Other Ambulatory Visit: Payer: Self-pay

## 2018-01-25 DIAGNOSIS — F331 Major depressive disorder, recurrent, moderate: Secondary | ICD-10-CM

## 2018-01-25 MED ORDER — SERTRALINE HCL 50 MG PO TABS: 50.0000 mg | ORAL_TABLET | Freq: Every day | ORAL | 1 refills | Status: DC

## 2018-01-25 NOTE — Progress Notes (Signed)
   Subjective:    Patient ID: Margaret Short, female    DOB: [birth year], 62 y.o.   MRN: [id number]  HPI    62 year old female presents for recurrent depression.. Symptoms returned in last 3-4 months.  Gradually worsening. She has been feeling sad, crying constantly. Worrying a lot, Feeling overwhelmed.  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  She restarted the zoloft on her on 2 weeks ago  She has not yet noted any improvement.  Initially in past causes increase in anxiety and insomnia but that SE went away in past.  Using Ambien for sleep.  [facility] 01/25/2018 05/21/2015  Decreased Interest 3 0  Down, Depressed, Hopeless 3 0  PHQ - 2 Score 6 0  Altered sleeping 3 -  Tired, decreased energy 3 -  Change in appetite 2 -  Feeling bad or failure about yourself  0 -  Trouble concentrating 2 -  Moving slowly or fidgety/restless 2 -  Suicidal thoughts 0 -  PHQ-9 Score 18 -  Difficult doing work/chores Somewhat difficult -     Review of Systems  Constitutional: Negative for fatigue and fever.  HENT: Negative for ear pain.   Eyes: Negative for pain.  Respiratory: Negative for chest tightness and shortness of breath.   Cardiovascular: Negative for chest pain, palpitations and leg swelling.  Gastrointestinal: Negative for abdominal pain.  Genitourinary: Negative for dysuria.       Objective:   Physical Exam  Constitutional: Vital signs are normal. She appears well-developed and well-nourished. She is cooperative.  Non-toxic appearance. She does not appear ill. No distress.  HENT:  Head: Normocephalic.  Right Ear: Hearing, tympanic membrane, external ear and ear canal normal. Tympanic membrane is not erythematous, not retracted and not bulging.  Left Ear: Hearing, tympanic membrane, external ear and ear canal normal. Tympanic membrane is not erythematous, not retracted and not bulging.  Nose: No mucosal edema or rhinorrhea. Right sinus exhibits no maxillary sinus tenderness  and no frontal sinus tenderness. Left sinus exhibits no maxillary sinus tenderness and no frontal sinus tenderness.  Mouth/Throat: Uvula is midline, oropharynx is clear and moist and mucous membranes are normal.  Eyes: Pupils are equal, round, and reactive to light. Conjunctivae, EOM and lids are normal. Lids are everted and swept, no foreign bodies found.  Neck: Trachea normal and normal range of motion. Neck supple. Carotid bruit is not present. No thyroid mass and no thyromegaly present.  Cardiovascular: Normal rate, regular rhythm, S1 normal, S2 normal, normal heart sounds, intact distal pulses and normal pulses. Exam reveals no gallop and no friction rub.  No murmur heard. Pulmonary/Chest: Effort normal and breath sounds normal. No tachypnea. No respiratory distress. She has no decreased breath sounds. She has no wheezes. She has no rhonchi. She has no rales.  Abdominal: Soft. Normal appearance and bowel sounds are normal. There is no tenderness.  Neurological: She is alert.  Skin: Skin is warm, dry and intact. No rash noted.  Psychiatric: Her speech is normal. Judgment and thought content normal. Her mood appears not anxious. Her affect is blunt. She is withdrawn. Cognition and memory are normal. She does not exhibit a depressed mood.          Assessment & Plan:

## 2018-01-25 NOTE — Assessment & Plan Note (Signed)
Plan to given zoloft longer to become effective. Follow up in 4 weeks for re-assessment.

## 2018-01-26 ENCOUNTER — Other Ambulatory Visit: Payer: Self-pay | Admitting: Family Medicine

## 2018-02-14 ENCOUNTER — Encounter: Payer: Self-pay | Admitting: Family Medicine

## 2018-02-18 ENCOUNTER — Encounter: Payer: Self-pay | Admitting: Family Medicine

## 2018-03-02 ENCOUNTER — Ambulatory Visit: Payer: BLUE CROSS/BLUE SHIELD | Admitting: Family Medicine

## 2018-05-22 ENCOUNTER — Ambulatory Visit (INDEPENDENT_AMBULATORY_CARE_PROVIDER_SITE_OTHER): Payer: 59 | Admitting: Family Medicine

## 2018-05-22 ENCOUNTER — Other Ambulatory Visit: Payer: Self-pay

## 2018-05-22 ENCOUNTER — Encounter: Payer: Self-pay | Admitting: Family Medicine

## 2018-05-22 VITALS — BP 110/70 | HR 68 | Temp 98.4°F | Ht 66.5 in | Wt 166.5 lb

## 2018-05-22 DIAGNOSIS — G4700 Insomnia, unspecified: Secondary | ICD-10-CM

## 2018-05-22 DIAGNOSIS — F331 Major depressive disorder, recurrent, moderate: Secondary | ICD-10-CM

## 2018-05-22 DIAGNOSIS — Z0000 Encounter for general adult medical examination without abnormal findings: Secondary | ICD-10-CM | POA: Diagnosis not present

## 2018-05-22 DIAGNOSIS — N318 Other neuromuscular dysfunction of bladder: Secondary | ICD-10-CM

## 2018-05-22 DIAGNOSIS — Z1211 Encounter for screening for malignant neoplasm of colon: Secondary | ICD-10-CM | POA: Diagnosis not present

## 2018-05-22 MED ORDER — ZOLPIDEM TARTRATE 10 MG PO TABS: 10.0000 mg | ORAL_TABLET | Freq: Every evening | ORAL | 0 refills | Status: DC | PRN

## 2018-05-22 MED ORDER — OXYBUTYNIN CHLORIDE ER 5 MG PO TB24: 5.0000 mg | ORAL_TABLET | ORAL | 3 refills | Status: DC

## 2018-05-22 MED ORDER — SERTRALINE HCL 50 MG PO TABS: 50.0000 mg | ORAL_TABLET | Freq: Every day | ORAL | 1 refills | Status: DC

## 2018-05-22 NOTE — Assessment & Plan Note (Signed)
Well controlled. Continue current medication.  

## 2018-05-22 NOTE — Progress Notes (Signed)
error 

## 2018-05-22 NOTE — Patient Instructions (Addendum)
Call to schedule mammogram on your own.   Please stop at the lab for stool cards.

## 2018-05-22 NOTE — Progress Notes (Signed)
Subjective:    Patient ID: Margaret Short, female    DOB: 07/13/1956, 62 y.o.   MRN: [id number]  HPI  The patient is here for annual wellness exam and preventative care.    Body mass index is 26.47 kg/m.  MDD, well controlled back on sertraline.   [facility]/22/2018 [facility]  PHQ-2 Total Score  0      Chronic insomnia: stable control on Ambien nightly.   Urge incontinence: Need refills of meds, this treats her symptoms well.  Diet: moderate  Exercise: daily, walking or biking.  Working from home now.  Social History /Family History/Past Medical History reviewed in detail and updated in EMR if needed. Blood pressure 110/70, pulse 68, temperature 98.4 F (36.9 C), temperature source Oral, height 5' 6.5" (1.689 m), weight 166 lb 8 oz (75.5 kg).   Review of Systems  Constitutional: Negative for fatigue and fever.  HENT: Negative for congestion.   Eyes: Negative for pain.  Respiratory: Negative for cough and shortness of breath.   Cardiovascular: Negative for chest pain, palpitations and leg swelling.  Gastrointestinal: Negative for abdominal pain.  Genitourinary: Negative for dysuria and vaginal bleeding.  Musculoskeletal: Negative for back pain.  Neurological: Negative for syncope, light-headedness and headaches.  Psychiatric/Behavioral: Negative for dysphoric mood.       Objective:   Physical Exam  Constitutional: Vital signs are normal. She appears well-developed and well-nourished. She is cooperative.  Non-toxic appearance. She does not appear ill. No distress.  HENT:  Head: Normocephalic.  Right Ear: Hearing, tympanic membrane, external ear and ear canal normal.  Left Ear: Hearing, tympanic membrane, external ear and ear canal normal.  Nose: Nose normal.  Eyes: Pupils are equal, round, and reactive to light. Conjunctivae, EOM and lids are normal. Lids are everted and swept, no foreign bodies found.  Neck: Trachea normal  and normal range of motion. Neck supple. Carotid bruit is not present. No thyroid mass and no thyromegaly present.  Cardiovascular: Normal rate, regular rhythm, S1 normal, S2 normal, normal heart sounds and intact distal pulses. Exam reveals no gallop.  No murmur heard. Pulmonary/Chest: Effort normal and breath sounds normal. No respiratory distress. She has no wheezes. She has no rhonchi. She has no rales.  Abdominal: Soft. Normal appearance and bowel sounds are normal. She exhibits no distension, no fluid wave, no abdominal bruit and no mass. There is no hepatosplenomegaly. There is no tenderness. There is no rebound, no guarding and no CVA tenderness. No hernia.  Lymphadenopathy:    She has no cervical adenopathy.    She has no axillary adenopathy.  Neurological: She is alert. She has normal strength. No cranial nerve deficit or sensory deficit.  Skin: Skin is warm, dry and intact. No rash noted.  Psychiatric: Her speech is normal and behavior is normal. Judgment normal. Her mood appears not anxious. Cognition and memory are normal. She does not exhibit a depressed mood.          Assessment & Plan:  The patient's preventative maintenance and recommended screening tests for an annual wellness exam were reviewed in full today. Brought up to date unless services declined.  Counselled on the importance of diet, exercise, and its role in overall health and mortality. The patient's FH and SH was reviewed, including their home life, tobacco status, and drug and alcohol status.   Vaccines: Td uptodate, plans Nonsmoker.  DVE/PAP: DVE not indicated, pap nml 2013, 2016 nml , neg co-testing,  repeat in 5 years.  No genital symptoms. Denies need for STD testing. Mammo: Nml 05/2016, plan q2 years. DEXA: No family history of osteoporosis, low risk.  Start age 62 Colon: No colonoscopy.. IFOB neg 2017, repeat yearly HCV screen: neg

## 2018-05-22 NOTE — Assessment & Plan Note (Signed)
No inappropriate use of Ambien, pt cannot sleep without it, ahs been taking for years. Other meds have not helped.

## 2018-05-23 ENCOUNTER — Other Ambulatory Visit: Payer: Self-pay | Admitting: Family Medicine

## 2018-05-23 DIAGNOSIS — Z1231 Encounter for screening mammogram for malignant neoplasm of breast: Secondary | ICD-10-CM

## 2018-05-28 ENCOUNTER — Other Ambulatory Visit (INDEPENDENT_AMBULATORY_CARE_PROVIDER_SITE_OTHER): Payer: 59

## 2018-05-28 DIAGNOSIS — Z1211 Encounter for screening for malignant neoplasm of colon: Secondary | ICD-10-CM | POA: Diagnosis not present

## 2018-05-28 LAB — FECAL OCCULT BLOOD, IMMUNOCHEMICAL: Fecal Occult Bld: POSITIVE — AB

## 2018-05-28 NOTE — Addendum Note (Signed)
Addended by: Eual FinesBRIDGES, Pearse Shiffler P on: 05/28/2018 04:50 PM   Modules accepted: Orders

## 2018-05-28 NOTE — Addendum Note (Signed)
Addended by: Eual FinesBRIDGES, SHANNON P on: 05/28/2018 04:53 PM   Modules accepted: Orders

## 2018-05-29 ENCOUNTER — Other Ambulatory Visit: Payer: Self-pay | Admitting: Family Medicine

## 2018-05-29 DIAGNOSIS — R195 Other fecal abnormalities: Secondary | ICD-10-CM

## 2018-05-30 ENCOUNTER — Other Ambulatory Visit: Payer: Self-pay

## 2018-05-30 DIAGNOSIS — R195 Other fecal abnormalities: Secondary | ICD-10-CM

## 2018-06-01 ENCOUNTER — Other Ambulatory Visit: Payer: BLUE CROSS/BLUE SHIELD

## 2018-06-08 ENCOUNTER — Encounter: Payer: BLUE CROSS/BLUE SHIELD | Admitting: Family Medicine

## 2018-06-13 ENCOUNTER — Ambulatory Visit
Admission: RE | Admit: 2018-06-13 | Discharge: 2018-06-13 | Disposition: A | Discharge status: Home / Self Care | Payer: 59 | Source: Ambulatory Visit | Attending: Family Medicine | Admitting: Family Medicine

## 2018-06-13 DIAGNOSIS — Z1231 Encounter for screening mammogram for malignant neoplasm of breast: Secondary | ICD-10-CM | POA: Insufficient documentation

## 2018-06-18 MED ORDER — SUCCINYLCHOLINE CHLORIDE 20 MG/ML IJ SOLN
INTRAMUSCULAR | Status: AC
  Filled 2018-06-18: qty 1

## 2018-06-19 ENCOUNTER — Encounter
Admission: RE | Disposition: A | Discharge status: Home / Self Care | Payer: Self-pay | Source: Ambulatory Visit | Attending: Gastroenterology

## 2018-06-19 ENCOUNTER — Ambulatory Visit
Admission: RE | Admit: 2018-06-19 | Discharge: 2018-06-19 | Disposition: A | Discharge status: Home / Self Care | Payer: 59 | Source: Ambulatory Visit | Attending: Gastroenterology | Admitting: Gastroenterology

## 2018-06-19 ENCOUNTER — Ambulatory Visit: Payer: 59 | Admitting: Anesthesiology

## 2018-06-19 DIAGNOSIS — F329 Major depressive disorder, single episode, unspecified: Secondary | ICD-10-CM | POA: Diagnosis not present

## 2018-06-19 DIAGNOSIS — K921 Melena: Secondary | ICD-10-CM | POA: Diagnosis present

## 2018-06-19 DIAGNOSIS — Z820 Family history of epilepsy and other diseases of the nervous system: Secondary | ICD-10-CM | POA: Insufficient documentation

## 2018-06-19 DIAGNOSIS — K5730 Diverticulosis of large intestine without perforation or abscess without bleeding: Secondary | ICD-10-CM | POA: Diagnosis not present

## 2018-06-19 DIAGNOSIS — Z79899 Other long term (current) drug therapy: Secondary | ICD-10-CM | POA: Insufficient documentation

## 2018-06-19 DIAGNOSIS — N3281 Overactive bladder: Secondary | ICD-10-CM | POA: Diagnosis not present

## 2018-06-19 DIAGNOSIS — Z947 Corneal transplant status: Secondary | ICD-10-CM | POA: Insufficient documentation

## 2018-06-19 DIAGNOSIS — Z8261 Family history of arthritis: Secondary | ICD-10-CM | POA: Diagnosis not present

## 2018-06-19 DIAGNOSIS — R195 Other fecal abnormalities: Secondary | ICD-10-CM | POA: Diagnosis not present

## 2018-06-19 DIAGNOSIS — Z800 Family history of malignant neoplasm of digestive organs: Secondary | ICD-10-CM | POA: Diagnosis not present

## 2018-06-19 DIAGNOSIS — K640 First degree hemorrhoids: Secondary | ICD-10-CM | POA: Diagnosis not present

## 2018-06-19 DIAGNOSIS — Z87891 Personal history of nicotine dependence: Secondary | ICD-10-CM | POA: Diagnosis not present

## 2018-06-19 HISTORY — PX: COLONOSCOPY WITH PROPOFOL: SHX5780

## 2018-06-19 SURGERY — COLONOSCOPY WITH PROPOFOL
Anesthesia: General

## 2018-06-19 MED ORDER — LIDOCAINE HCL (CARDIAC) PF 100 MG/5ML IV SOSY
PREFILLED_SYRINGE | INTRAVENOUS | Status: DC | PRN
  Administered 2018-06-19: 100 mg via INTRAVENOUS

## 2018-06-19 MED ORDER — PROPOFOL 10 MG/ML IV BOLUS
INTRAVENOUS | Status: AC
  Filled 2018-06-19: qty 20

## 2018-06-19 MED ORDER — LIDOCAINE HCL (PF) 2 % IJ SOLN
INTRAMUSCULAR | Status: AC
  Filled 2018-06-19: qty 10

## 2018-06-19 MED ORDER — PROPOFOL 500 MG/50ML IV EMUL
INTRAVENOUS | Status: AC
  Filled 2018-06-19: qty 50

## 2018-06-19 MED ORDER — PROPOFOL 500 MG/50ML IV EMUL
INTRAVENOUS | Status: DC | PRN
  Administered 2018-06-19: 120 ug/kg/min via INTRAVENOUS

## 2018-06-19 MED ORDER — SODIUM CHLORIDE 0.9 % IV SOLN
INTRAVENOUS | Status: DC
  Administered 2018-06-19: 1000 mL via INTRAVENOUS

## 2018-06-19 MED ORDER — PROPOFOL 10 MG/ML IV BOLUS
INTRAVENOUS | Status: DC | PRN
  Administered 2018-06-19: 100 mg via INTRAVENOUS

## 2018-06-19 NOTE — Anesthesia Post-op Follow-up Note (Signed)
Anesthesia QCDR form completed.        

## 2018-06-19 NOTE — H&P (Addendum)
Wyline Mood, MD [address], [place], [place], [address] [address], [address], [place], [place], [address] Phone: [phone]  Fax: [phone]  Primary Care Physician:  Excell Seltzer, MD   Pre-Procedure History & Physical: HPI:  Margaret Short is a 62 y.o. female is here for an colonoscopy.   Past Medical History:  Diagnosis Date  . Depression   . Overactive bladder     Past Surgical History:  Procedure Laterality Date  . CORNEAL TRANSPLANT Right   . Keraplasty  1996  . LAMELLAR KERATOPLASTY  1985   partial    Prior to Admission medications   Medication Sig Start Date End Date Taking? Authorizing Provider  oxybutynin (DITROPAN-XL) 5 MG 24 hr tablet Take 1 tablet (5 mg total) by mouth every other day. 05/22/18  Yes Bedsole, Amy E, MD  sertraline (ZOLOFT) 50 MG tablet Take 1 tablet (50 mg total) by mouth daily. 05/22/18  Yes Bedsole, Amy E, MD  zolpidem (AMBIEN) 10 MG tablet Take 1 tablet (10 mg total) by mouth at bedtime as needed. 05/22/18  Yes Excell Seltzer, MD    Allergies as of 05/31/2018  . (No Known Allergies)    Family History  Problem Relation Age of Onset  . Cancer Father        oral cancer due to tobacco use  . Hypertension Mother   . Osteoarthritis Mother   . Healthy Brother   . Healthy Brother   . Healthy Brother   . Multiple sclerosis Paternal Aunt   . Breast cancer Neg Hx     Social History   Socioeconomic History  . Marital status: Single    Spouse name: Not on file  . Number of children: Not on file  . Years of education: Not on file  . Highest education level: Not on file  Occupational History  . Occupation: Professor    Associate Professor: [employer]  Social Needs  . Financial resource strain: Not on file  . Food insecurity:    Worry: Not on file    Inability: Not on file  . Transportation needs:    Medical: Not on file    Non-medical: Not on file  Tobacco Use  . Smoking status: Former Smoker   Last attempt to quit: 1981    Years since quitting: 38.7  . Smokeless tobacco: Never Used  Substance and Sexual Activity  . Alcohol use: No  . Drug use: No  . Sexual activity: Never  Lifestyle  . Physical activity:    Days per week: Not on file    Minutes per session: Not on file  . Stress: Not on file  Relationships  . Social connections:    Talks on phone: Not on file    Gets together: Not on file    Attends religious service: Not on file    Active member of club or organization: Not on file    Attends meetings of clubs or organizations: Not on file    Relationship status: Not on file  . Intimate partner violence:    Fear of current or ex partner: Not on file    Emotionally abused: Not on file    Physically abused: Not on file    Forced sexual activity: Not on file  Other Topics Concern  . Not on file  Social History Narrative   Regular exercise:yes, daily treadmill   Diet: veggies, water minimal  Review of Systems: See HPI, otherwise negative ROS  Physical Exam: BP 138/89   Pulse 76   Temp (!) 97.5 F (36.4 C) (Tympanic)   Resp 20   Ht 5\' 7"  (1.702 m)   Wt 72.6 kg   SpO2 100%   BMI 25.06 kg/m  General:   Alert,  pleasant and cooperative in NAD Head:  Normocephalic and atraumatic. Neck:  Supple; no masses or thyromegaly. Lungs:  Clear throughout to auscultation, normal respiratory effort.    Heart:  +S1, +S2, Regular rate and rhythm, No edema. Abdomen:  Soft, nontender and nondistended. Normal bowel sounds, without guarding, and without rebound.   Neurologic:  Alert and  oriented x4;  grossly normal neurologically.  Impression/Plan: Margaret Short is here for an colonoscopy to be performed for positive stool occult test Risks, benefits, limitations, and alternatives regarding  colonoscopy have been reviewed with the patient.  Questions have been answered.  All parties agreeable.   Wyline Mood, MD  06/19/2018, 8:05 AM

## 2018-06-19 NOTE — Transfer of Care (Signed)
Immediate Anesthesia Transfer of Care Note  Patient: Margaret Short  Procedure(s) Performed: COLONOSCOPY WITH PROPOFOL (N/A )  Patient Location: PACU and Endoscopy Unit  Anesthesia Type:General  Level of Consciousness: awake  Airway & Oxygen Therapy: Patient Spontanous Breathing  Post-op Assessment: Report given to RN  Post vital signs: stable  Last Vitals:  Vitals Value Taken Time  BP    Temp    Pulse    Resp    SpO2      Last Pain:  Vitals:   06/19/18 0713  TempSrc: Tympanic         Complications: No apparent anesthesia complications

## 2018-06-19 NOTE — Anesthesia Preprocedure Evaluation (Signed)
Anesthesia Evaluation  Patient identified by MRN, date of birth, ID band Patient awake    Reviewed: Allergy & Precautions, H&P , NPO status , reviewed documented beta blocker date and time   Airway Mallampati: II  TM Distance: >3 FB Neck ROM: full    Dental  (+) Teeth Intact   Pulmonary former smoker,    Pulmonary exam normal        Cardiovascular Normal cardiovascular exam     Neuro/Psych PSYCHIATRIC DISORDERS Depression  Neuromuscular disease    GI/Hepatic neg GERD  ,  Endo/Other    Renal/GU      Musculoskeletal   Abdominal   Peds  Hematology   Anesthesia Other Findings Past Medical History: No date: Depression No date: Overactive bladder  Past Surgical History: No date: CORNEAL TRANSPLANT; Right 1996: Keraplasty 1985: LAMELLAR KERATOPLASTY     Comment:  partial  BMI    Body Mass Index:  25.06 kg/m      Reproductive/Obstetrics                             Anesthesia Physical Anesthesia Plan  ASA: II  Anesthesia Plan: General   Post-op Pain Management:    Induction: Intravenous  PONV Risk Score and Plan: 3 and Treatment may vary due to age or medical condition and TIVA  Airway Management Planned: Nasal Cannula and Natural Airway  Additional Equipment:   Intra-op Plan:   Post-operative Plan:   Informed Consent: I have reviewed the patients History and Physical, chart, labs and discussed the procedure including the risks, benefits and alternatives for the proposed anesthesia with the patient or authorized representative who has indicated his/her understanding and acceptance.   Dental Advisory Given  Plan Discussed with:   Anesthesia Plan Comments:         Anesthesia Quick Evaluation

## 2018-06-19 NOTE — Anesthesia Postprocedure Evaluation (Signed)
Anesthesia Post Note  Patient: Margaret Short  Procedure(s) Performed: COLONOSCOPY WITH PROPOFOL (N/A )  Patient location during evaluation: Endoscopy Anesthesia Type: General Level of consciousness: awake and alert Pain management: pain level controlled Vital Signs Assessment: post-procedure vital signs reviewed and stable Respiratory status: spontaneous breathing, nonlabored ventilation and respiratory function stable Cardiovascular status: blood pressure returned to baseline and stable Postop Assessment: no apparent nausea or vomiting Anesthetic complications: no     Last Vitals:  Vitals:   06/19/18 0851 06/19/18 0901  BP: (!) 130/41 137/80  Pulse: 64 (!) 56  Resp: 19 11  Temp:    SpO2: 100% 100%    Last Pain:  Vitals:   06/19/18 0851  TempSrc:   PainSc: 0-No pain                 Christia Reading

## 2018-06-19 NOTE — Op Note (Signed)
[facility] Patient Name: Margaret Short Procedure Date: 06/19/2018 7:39 AM MRN: [id number] Account #: [id number] Date of Birth: 1956-07-29 Admit Type: Outpatient Age: 62 Room: [facility] ENDO ROOM 1 Gender: Female Note Status: Finalized Procedure:            Colonoscopy Indications:          Gastrointestinal occult blood loss Providers:            Wyline Mood MD, MD Referring MD:         Excell Seltzer MD, MD (Referring MD) Medicines:            Monitored Anesthesia Care Complications:        No immediate complications. Procedure:            Pre-Anesthesia Assessment:                       - Prior to the procedure, a History and Physical was                        performed, and patient medications, allergies and                        sensitivities were reviewed. The patient's tolerance of                        previous anesthesia was reviewed.                       - The risks and benefits of the procedure and the                        sedation options and risks were discussed with the                        patient. All questions were answered and informed                        consent was obtained.                       - ASA Grade Assessment: II - A patient with mild                        systemic disease.                       After obtaining informed consent, the colonoscope was                        passed under direct vision. Throughout the procedure,                        the patient's blood pressure, pulse, and oxygen                        saturations were monitored continuously. The                        Colonoscope was introduced through the anus and  advanced to the the cecum, identified by the                        appendiceal orifice, IC valve and transillumination.                        The colonoscopy was performed with ease. The patient                        tolerated the procedure well. The quality  of the bowel                        preparation was good. Findings:      The perianal and digital rectal examinations were normal.      Internal hemorrhoids were found during retroflexion. The hemorrhoids       were small and Grade I (internal hemorrhoids that do not prolapse).      Multiple small-mouthed diverticula were found in the sigmoid colon.      The exam was otherwise without abnormality on direct and retroflexion       views. Impression:           - Internal hemorrhoids.                       - Diverticulosis in the sigmoid colon.                       - The examination was otherwise normal on direct and                        retroflexion views.                       - No specimens collected. Recommendation:       - Discharge patient to home (with escort).                       - Resume previous diet.                       - Continue present medications.                       - Repeat colonoscopy in 10 years for screening purposes. Procedure Code(s):    --- Professional ---                       [phone], Colonoscopy, flexible; diagnostic, including                        collection of specimen(s) by brushing or washing, when                        performed (separate procedure) Diagnosis Code(s):    --- Professional ---                       K64.0, First degree hemorrhoids                       R19.5, Other fecal abnormalities  K57.30, Diverticulosis of large intestine without                        perforation or abscess without bleeding CPT copyright 2017 American Medical Association. All rights reserved. The codes documented in this report are preliminary and upon coder review may  be revised to meet current compliance requirements. Wyline Mood, MD Wyline Mood MD, MD 06/19/2018 8:36:27 AM This report has been signed electronically. Number of Addenda: 0 Note Initiated On: 06/19/2018 7:39 AM Scope Withdrawal Time: 0 hours 14 minutes 42 seconds  Total  Procedure Duration: 0 hours 21 minutes 48 seconds       [facility]

## 2018-06-20 ENCOUNTER — Encounter: Payer: Self-pay | Admitting: Gastroenterology

## 2018-10-30 IMAGING — MG MM DIGITAL SCREENING BILAT W/ TOMO W/ CAD
8 series · 9 of 24 positions shown · non-contrast
Comparison: Previous exam(s).

CLINICAL DATA: Screening.

EXAM:
DIGITAL SCREENING BILATERAL MAMMOGRAM WITH TOMO AND CAD

[L MLO synth-2D]
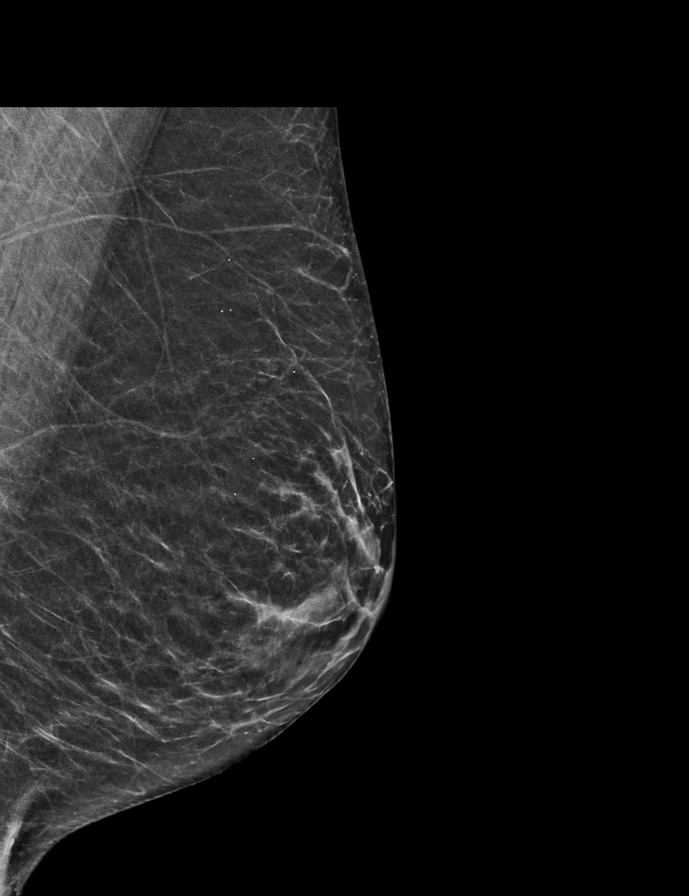

[R MLO synth-2D]
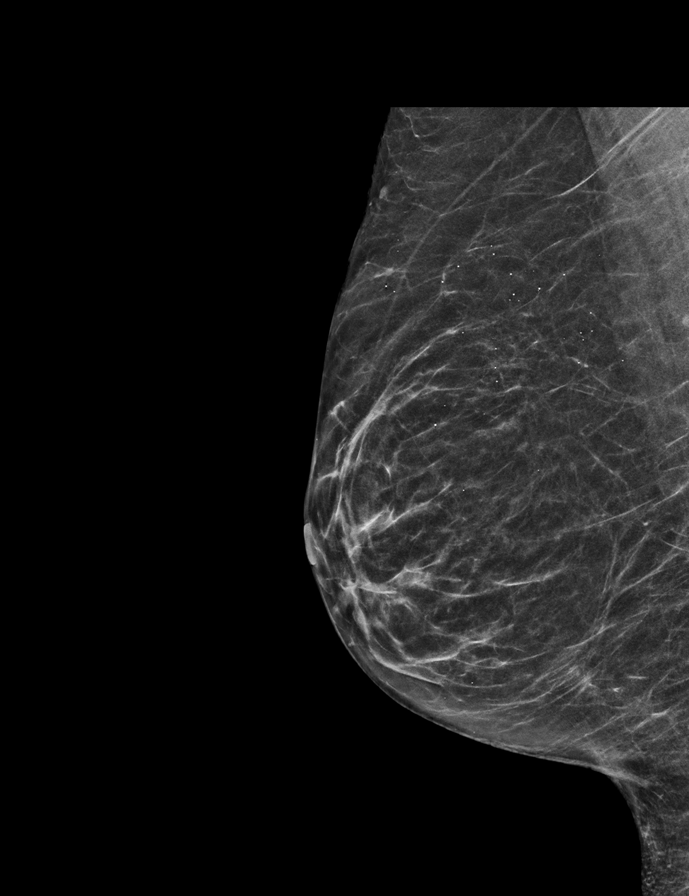

[L CC synth-2D]
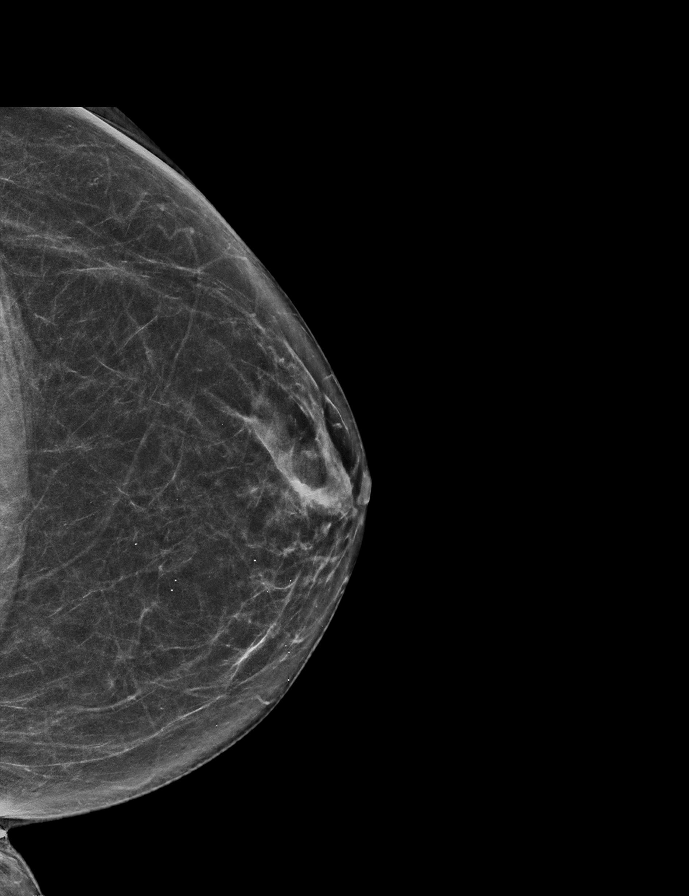

[R CC synth-2D]
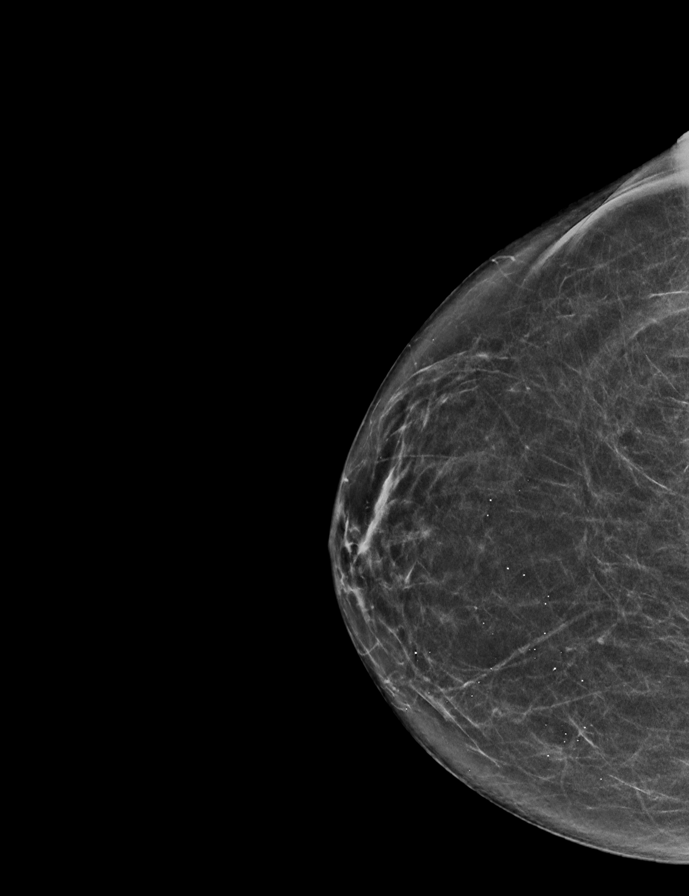

[R CC tomo · 2 of 58 frames shown]
[frame 19/58]
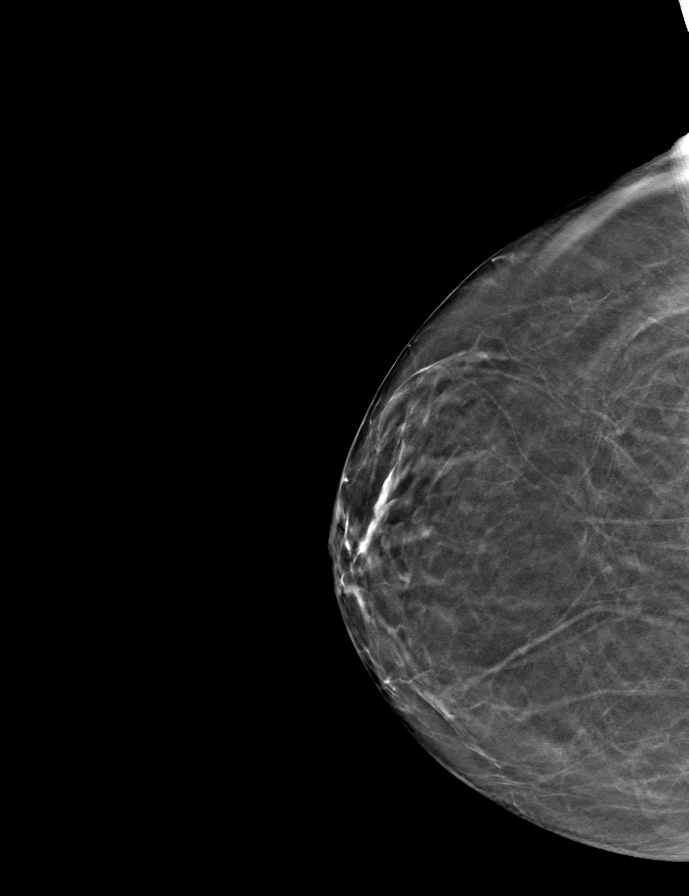
[frame 29/58]
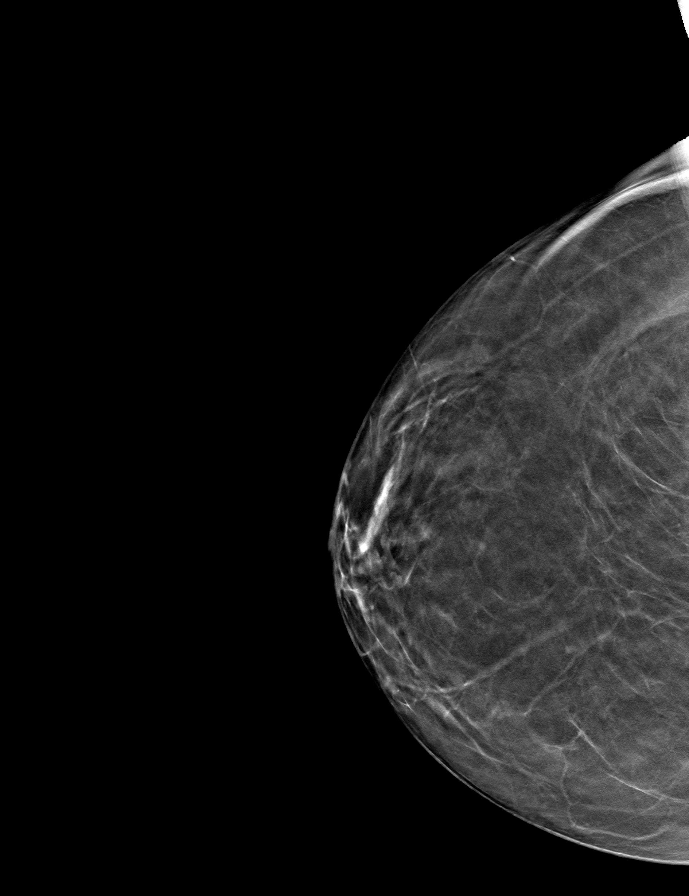

[R MLO tomo · tomo slice 27/52.0]
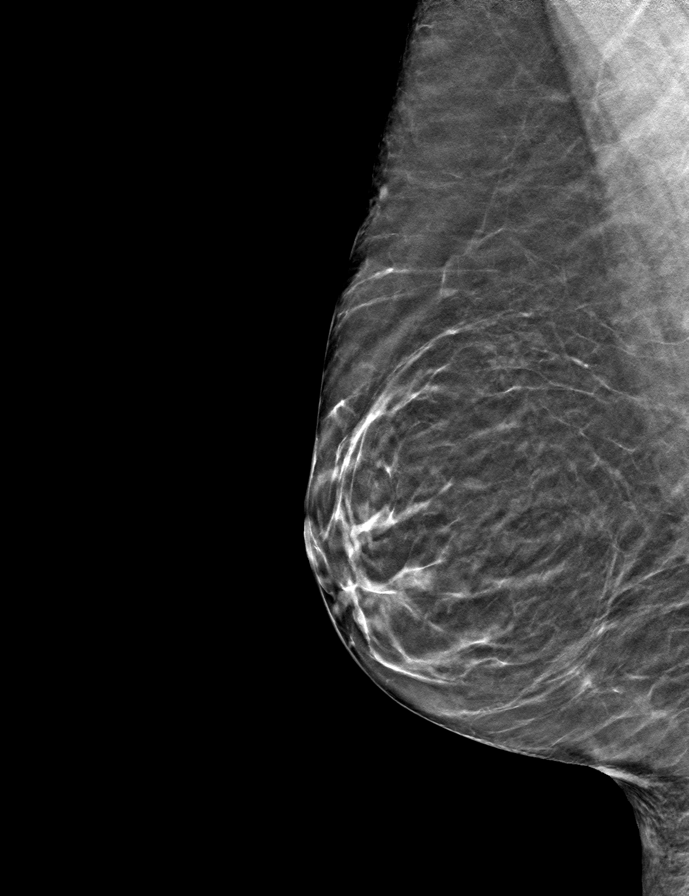

[L CC tomo · tomo slice 29/58.0]
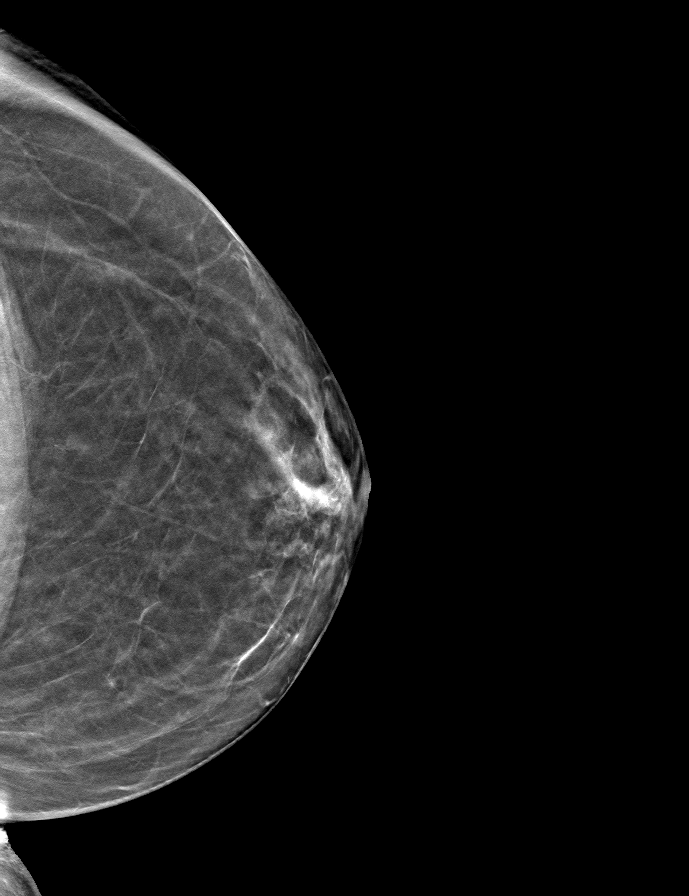

[L MLO tomo · tomo slice 25/48.0]
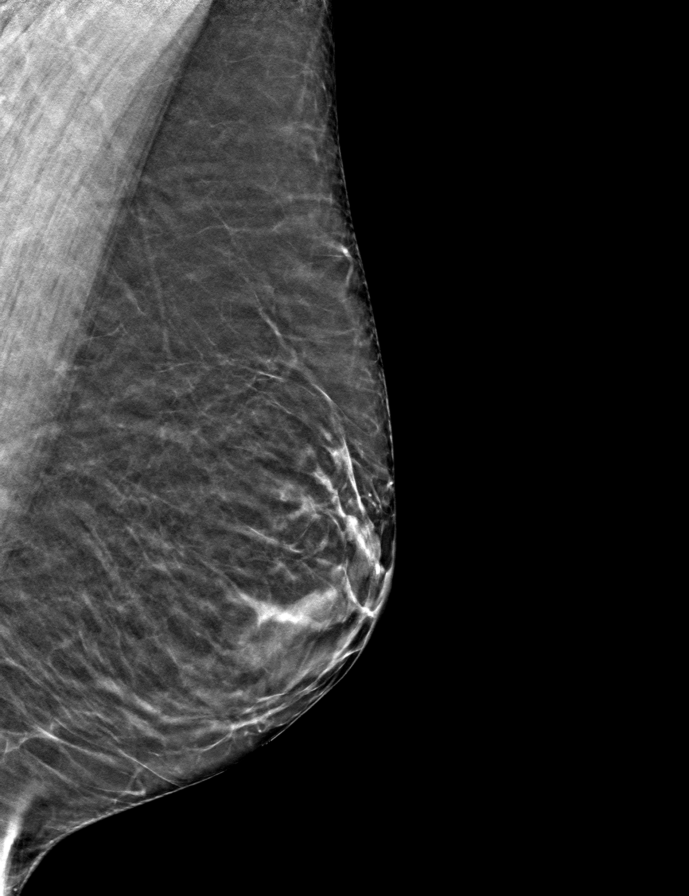

[9 of 24 positions shown; findings below may reference images not displayed]

ACR Breast Density Category b: There are scattered areas of
fibroglandular density.
FINDINGS: There are no findings suspicious for malignancy. Images were
processed with CAD.
IMPRESSION: No mammographic evidence of malignancy. A result letter of this
screening mammogram will be mailed directly to the patient.

RECOMMENDATION:
Screening mammogram in one year. (Code:CN-U-775)

BI-RADS CATEGORY  1: Negative.

## 2018-11-19 ENCOUNTER — Other Ambulatory Visit: Payer: Self-pay | Admitting: Family Medicine

## 2018-11-19 NOTE — Telephone Encounter (Signed)
done

## 2018-11-19 NOTE — Telephone Encounter (Signed)
Last office visit 05/22/2018 for CPE.  Last refilled 06/09/2018 for #90 with no refills.  No future appointments scheduled.  Ok to refill?

## 2019-02-14 ENCOUNTER — Encounter: Payer: Self-pay | Admitting: *Deleted

## 2019-02-26 ENCOUNTER — Encounter: Payer: Self-pay | Admitting: Family Medicine

## 2019-02-26 ENCOUNTER — Ambulatory Visit (INDEPENDENT_AMBULATORY_CARE_PROVIDER_SITE_OTHER): Payer: 59 | Admitting: Family Medicine

## 2019-02-26 VITALS — Ht 66.5 in

## 2019-02-26 DIAGNOSIS — G4700 Insomnia, unspecified: Secondary | ICD-10-CM

## 2019-02-26 DIAGNOSIS — F331 Major depressive disorder, recurrent, moderate: Secondary | ICD-10-CM

## 2019-02-26 NOTE — Assessment & Plan Note (Addendum)
Stable control on ambien.. has to take to sleep.   Has tried other meds in past.. did not work.

## 2019-02-26 NOTE — Assessment & Plan Note (Signed)
Excellent control on sertraline 50 mg daily. No SE.  Working on healthy lifestyle habits, exercising daily.

## 2019-02-26 NOTE — Progress Notes (Signed)
VIRTUAL VISIT Due to national recommendations of social distancing due to COVID 19, a virtual visit is felt to be most appropriate for this patient at this time.   I connected with the patient on 02/26/19 at  8:20 AM EDT by virtual telehealth platform and verified that I am speaking with the correct person using two identifiers.   I discussed the limitations, risks, security and privacy concerns of performing an evaluation and management service by  virtual telehealth platform and the availability of in person appointments. I also discussed with the patient that there may be a patient responsible charge related to this service. The patient expressed understanding and agreed to proceed.  Patient location: Home Provider Location: [facility] Participants: Margaret Short and Kathrine Cords   Chief Complaint  Patient presents with  . Follow-up    Depression    History of Present Illness: 63 year old female presents for follow up[ depression. She is currently doing very well with her mood on sertraline 50 mg daily. No side effects.  She continues to require zolpidem for sleep at night.   She was already working at home.. minimal change.  Sleeping 7 hours a night, minimal issue falling asleep.   Getting daily exercise.. walking.  Depression screen [facility] 2/9 02/26/2019 05/22/2018 01/25/2018  Decreased Interest 0 0 3  Down, Depressed, Hopeless 0 0 3  PHQ - 2 Score 0 0 6  Altered sleeping 0 - 3  Tired, decreased energy 0 - 3  Change in appetite 0 - 2  Feeling bad or failure about yourself  0 - 0  Trouble concentrating 0 - 2  Moving slowly or fidgety/restless 0 - 2  Suicidal thoughts 0 - 0  PHQ-9 Score 0 - 18  Difficult doing work/chores Not difficult at all - Somewhat difficult    COVID 19 screen No recent travel or known exposure to COVID19 The patient denies respiratory symptoms of COVID 19 at this time.  The importance of social distancing was discussed today.   Review of  Systems  Constitutional: Negative for chills and fever.  HENT: Negative for congestion and ear pain.   Eyes: Negative for pain and redness.  Respiratory: Negative for cough and shortness of breath.   Cardiovascular: Negative for chest pain, palpitations and leg swelling.  Gastrointestinal: Negative for abdominal pain, blood in stool, constipation, diarrhea, nausea and vomiting.  Genitourinary: Negative for dysuria.  Musculoskeletal: Negative for falls and myalgias.  Skin: Negative for rash.  Neurological: Negative for dizziness.  Psychiatric/Behavioral: Negative for depression. The patient is not nervous/anxious.       Past Medical History:  Diagnosis Date  . Depression   . Overactive bladder     reports that she quit smoking about 39 years ago. She has never used smokeless tobacco. She reports that she does not drink alcohol or use drugs.   Current Outpatient Medications:  .  oxybutynin (DITROPAN-XL) 5 MG 24 hr tablet, Take 1 tablet (5 mg total) by mouth every other day., Disp: 45 tablet, Rfl: 3 .  sertraline (ZOLOFT) 50 MG tablet, TAKE 1 TABLET DAILY, Disp: 90 tablet, Rfl: 1 .  zolpidem (AMBIEN) 10 MG tablet, TAKE 1 TABLET AT BEDTIME ASNEEDED, Disp: 90 tablet, Rfl: 0   Observations/Objective: Height 5' 6.5" (1.689 m).  Physical Exam  Physical Exam Constitutional:      General: The patient is not in acute distress. Pulmonary:     Effort: Pulmonary effort is normal. No respiratory distress.  Neurological:  Mental Status: The patient is alert and oriented to person, place, and time.  Psychiatric:        Mood and Affect: Mood normal.        Behavior: Behavior normal.   Assessment and Plan MDD (major depressive disorder), recurrent episode, moderate (HCC) Excellent control on sertraline 50 mg daily. No SE.  Working on healthy lifestyle habits, exercising daily.  INSOMNIA, CHRONIC Stable control on ambien.. has to take to sleep.   Has tried other meds in past.. did not  work.     I discussed the assessment and treatment plan with the patient. The patient was provided an opportunity to ask questions and all were answered. The patient agreed with the plan and demonstrated an understanding of the instructions.   The patient was advised to call back or seek an in-person evaluation if the symptoms worsen or if the condition fails to improve as anticipated.     Margaret NoraAmy Bedsole, MD

## 2019-03-26 ENCOUNTER — Other Ambulatory Visit: Payer: Self-pay | Admitting: Family Medicine

## 2019-03-26 NOTE — Telephone Encounter (Signed)
Last office visit 02/26/2019 for MDD.  Last refilled 11/19/2018 for #90 with no refills.  No future appointments.

## 2019-06-28 ENCOUNTER — Encounter: Payer: Self-pay | Admitting: Family Medicine

## 2019-06-28 ENCOUNTER — Ambulatory Visit (INDEPENDENT_AMBULATORY_CARE_PROVIDER_SITE_OTHER): Payer: 59 | Admitting: Family Medicine

## 2019-06-28 ENCOUNTER — Other Ambulatory Visit: Payer: Self-pay

## 2019-06-28 ENCOUNTER — Encounter: Payer: 59 | Admitting: Family Medicine

## 2019-06-28 VITALS — BP 130/80 | HR 72 | Temp 97.1°F | Ht 66.0 in | Wt 176.5 lb

## 2019-06-28 DIAGNOSIS — N318 Other neuromuscular dysfunction of bladder: Secondary | ICD-10-CM

## 2019-06-28 DIAGNOSIS — G4700 Insomnia, unspecified: Secondary | ICD-10-CM | POA: Diagnosis not present

## 2019-06-28 DIAGNOSIS — Z0000 Encounter for general adult medical examination without abnormal findings: Secondary | ICD-10-CM

## 2019-06-28 DIAGNOSIS — F331 Major depressive disorder, recurrent, moderate: Secondary | ICD-10-CM

## 2019-06-28 NOTE — Progress Notes (Signed)
Chief Complaint  Patient presents with  . Annual Exam    History of Present Illness: HPI  The patient is here for annual wellness exam and preventative care.      She is not interested in labs this year.  MDD: well controlled on sertraline and  Ambien for sleep.  Urge incontinence: Need refills of meds, this treats her symptoms well.  Diet: moderate.. return to weight wathers  Exercise: daily, walking  Working from home now.   Wt Readings from Last 3 Encounters:  06/28/19 176 lb 8 oz (80.1 kg)  06/19/18 160 lb (72.6 kg)  05/22/18 166 lb 8 oz (75.5 kg)     COVID 19 screen No recent travel or known exposure to COVID19 The patient denies respiratory symptoms of COVID 19 at this time.  The importance of social distancing was discussed today.   Review of Systems  Constitutional: Negative for chills and fever.  HENT: Negative for congestion and ear pain.   Eyes: Negative for pain and redness.  Respiratory: Negative for cough and shortness of breath.   Cardiovascular: Negative for chest pain, palpitations and leg swelling.  Gastrointestinal: Negative for abdominal pain, blood in stool, constipation, diarrhea, nausea and vomiting.  Genitourinary: Negative for dysuria.  Musculoskeletal: Negative for falls and myalgias.  Skin: Negative for rash.  Neurological: Negative for dizziness.  Psychiatric/Behavioral: Negative for depression. The patient is not nervous/anxious.       Past Medical History:  Diagnosis Date  . Depression   . Overactive bladder     reports that she quit smoking about 39 years ago. She has never used smokeless tobacco. She reports that she does not drink alcohol or use drugs.   Current Outpatient Medications:  .  oxybutynin (DITROPAN-XL) 5 MG 24 hr tablet, Take 1 tablet (5 mg total) by mouth every other day., Disp: 45 tablet, Rfl: 3 .  sertraline (ZOLOFT) 50 MG tablet, TAKE 1 TABLET DAILY, Disp: 90 tablet, Rfl: 1 .  zolpidem (AMBIEN) 10 MG tablet,  TAKE 1 TABLET AT BEDTIME ASNEEDED, Disp: 90 tablet, Rfl: 0   Observations/Objective: Blood pressure 130/80, pulse 72, temperature (!) 97.1 F (36.2 C), temperature source Temporal, height 5\' 6"  (1.676 m), weight 176 lb 8 oz (80.1 kg), SpO2 98 %.  Physical Exam Constitutional:      General: She is not in acute distress.    Appearance: Normal appearance. She is well-developed. She is not ill-appearing or toxic-appearing.  HENT:     Head: Normocephalic.     Right Ear: Hearing, tympanic membrane, ear canal and external ear normal.     Left Ear: Hearing, tympanic membrane, ear canal and external ear normal.     Nose: Nose normal.  Eyes:     General: Lids are normal. Lids are everted, no foreign bodies appreciated.     Conjunctiva/sclera: Conjunctivae normal.     Pupils: Pupils are equal, round, and reactive to light.  Neck:     Musculoskeletal: Normal range of motion and neck supple.     Thyroid: No thyroid mass or thyromegaly.     Vascular: No carotid bruit.     Trachea: Trachea normal.  Cardiovascular:     Rate and Rhythm: Normal rate and regular rhythm.     Heart sounds: Normal heart sounds, S1 normal and S2 normal. No murmur. No gallop.   Pulmonary:     Effort: Pulmonary effort is normal. No respiratory distress.     Breath sounds: Normal breath sounds. No wheezing, rhonchi  or rales.  Abdominal:     General: Bowel sounds are normal. There is no distension or abdominal bruit.     Palpations: Abdomen is soft. There is no fluid wave or mass.     Tenderness: There is no abdominal tenderness. There is no guarding or rebound.     Hernia: No hernia is present.  Lymphadenopathy:     Cervical: No cervical adenopathy.  Skin:    General: Skin is warm and dry.     Findings: No rash.  Neurological:     Mental Status: She is alert.     Cranial Nerves: No cranial nerve deficit.     Sensory: No sensory deficit.  Psychiatric:        Mood and Affect: Mood is not anxious or depressed.         Speech: Speech normal.        Behavior: Behavior normal. Behavior is cooperative.        Judgment: Judgment normal.      Assessment and Plan   The patient's preventative maintenance and recommended screening tests for an annual wellness exam were reviewed in full today. Brought up to date unless services declined.  Counselled on the importance of diet, exercise, and its role in overall health and mortality. The patient's FH and SH was reviewed, including their home life, tobacco status, and drug and alcohol status.   Vaccines: Td uptodate,  Had flu 05/2019 Nonsmoker.  DVE/PAP: DVE not indicated, pap nml 2013, 2016 nml , neg co-testing, repeat in 5 years.  No genital symptoms. Denies need for STD testing. Mammo: Nml 06/2018, plan q2 years. DEXA: No family history of osteoporosis, low risk.  Start age 63. Colon:  IFOB  Positive  2019.. neg colonoscopy.. nml  HCV screen: neg   Kerby NoraAmy , MD

## 2019-06-28 NOTE — Patient Instructions (Signed)
Keep working on regular exercise and get back to heart healthy diet.

## 2019-07-03 MED ORDER — ZOLPIDEM TARTRATE 10 MG PO TABS: ORAL_TABLET | ORAL | 0 refills | Status: DC

## 2019-07-03 MED ORDER — SERTRALINE HCL 50 MG PO TABS: 50.0000 mg | ORAL_TABLET | Freq: Every day | ORAL | 1 refills | Status: DC

## 2019-07-03 MED ORDER — OXYBUTYNIN CHLORIDE ER 5 MG PO TB24: 5.0000 mg | ORAL_TABLET | ORAL | 3 refills | Status: DC

## 2019-07-03 NOTE — Assessment & Plan Note (Signed)
Well controlled. Continue current medication.  

## 2019-07-03 NOTE — Assessment & Plan Note (Signed)
Stable Ambien use, unremarkable PDMP review.

## 2019-12-20 ENCOUNTER — Ambulatory Visit: Payer: 59 | Attending: Internal Medicine

## 2019-12-20 DIAGNOSIS — Z23 Encounter for immunization: Secondary | ICD-10-CM

## 2019-12-20 NOTE — Progress Notes (Signed)
   Covid-19 Vaccination Clinic  Name:  Margaret Short    MRN: [id number] DOB: 1956/05/22  12/20/2019  [name] was observed post Covid-19 immunization for 15 minutes without incident. She was provided with Vaccine Information Sheet and instruction to access the V-Safe system.   [name] was instructed to call 911 with any severe reactions post vaccine: Marland Kitchen Difficulty breathing  . Swelling of face and throat  . A fast heartbeat  . A bad rash all over body  . Dizziness and weakness   Immunizations Administered    Name Date Dose VIS Date Route   Moderna COVID-19 Vaccine 12/20/2019  9:36 AM 0.5 mL 09/10/2019 Intramuscular   Manufacturer: Moderna   Lot: [id number]   NDC: 62035-597-41

## 2019-12-23 ENCOUNTER — Other Ambulatory Visit: Payer: Self-pay | Admitting: Family Medicine

## 2019-12-23 NOTE — Telephone Encounter (Signed)
Last office visit 06/28/2019 for CPE.  Last refilled 07/03/2019 for #90 with no refills.  No future appointments with PCP.

## 2019-12-25 ENCOUNTER — Telehealth: Payer: Self-pay | Admitting: *Deleted

## 2019-12-25 NOTE — Telephone Encounter (Signed)
Spoke with Ms. Schloss.  She is good with changing the directions to 1/2 tablet at bedtime as needed for sleep.  Will forward to Dr. Ermalene Searing to sign prescription with new dosing instructions.

## 2019-12-25 NOTE — Addendum Note (Signed)
Addended by: Damita Lack on: 12/25/2019 03:07 PM   Modules accepted: Orders

## 2019-12-25 NOTE — Telephone Encounter (Signed)
Ask pt if she has tried and failed  lower dose 1/2 tab daily

## 2019-12-25 NOTE — Telephone Encounter (Signed)
Left message for Ms. Manygoats to return my call.

## 2019-12-25 NOTE — Telephone Encounter (Signed)
Received fax from [facility] stating patient's benefit only allows 1/2 tablet a day of Zolpidem 10 mg or obtain a Prior Authorization.  Change to 1/2 tablet daily or do PA?  Please advise.

## 2019-12-30 MED ORDER — ZOLPIDEM TARTRATE 10 MG PO TABS: ORAL_TABLET | ORAL | 0 refills | Status: DC

## 2019-12-30 NOTE — Telephone Encounter (Signed)
Patient is calling back in regards to refill She stated that she spoke with the pharmacy and they do not show that they received a refill from our office

## 2019-12-30 NOTE — Telephone Encounter (Signed)
Rx signed.

## 2019-12-30 NOTE — Telephone Encounter (Signed)
Refill was not signed.  Will send back to Dr. Ermalene Searing to sign Rx below.

## 2019-12-30 NOTE — Addendum Note (Signed)
Addended by: [name] E on: 12/30/2019 03:18 PM   Modules accepted: Orders

## 2020-01-17 ENCOUNTER — Ambulatory Visit: Payer: 59

## 2020-01-18 ENCOUNTER — Ambulatory Visit: Payer: 59

## 2020-01-18 ENCOUNTER — Ambulatory Visit: Payer: 59 | Attending: Internal Medicine

## 2020-01-18 DIAGNOSIS — Z23 Encounter for immunization: Secondary | ICD-10-CM

## 2020-01-18 NOTE — Progress Notes (Signed)
   Covid-19 Vaccination Clinic  Name:  Margaret Short    MRN: [id number] DOB: 04-Nov-1955  01/18/2020  [name] was observed post Covid-19 immunization for 15 minutes without incident. She was provided with Vaccine Information Sheet and instruction to access the V-Safe system.   [name] was instructed to call 911 with any severe reactions post vaccine: Marland Kitchen Difficulty breathing  . Swelling of face and throat  . A fast heartbeat  . A bad rash all over body  . Dizziness and weakness   Immunizations Administered    Name Date Dose VIS Date Route   Moderna COVID-19 Vaccine 01/18/2020 10:00 AM 0.5 mL 09/10/2019 Intramuscular   Manufacturer: Moderna   Lot: [id number]   NDC: 15806-386-85

## 2020-01-21 ENCOUNTER — Ambulatory Visit: Payer: 59

## 2020-05-04 NOTE — Telephone Encounter (Signed)
Last office visit 06/28/2019 for CPE.  Last refilled 12/30/2019 for #45 with no refills.  CPE scheduled 06/30/2020.

## 2020-05-06 MED ORDER — ZOLPIDEM TARTRATE 5 MG PO TABS: 5.0000 mg | ORAL_TABLET | Freq: Every evening | ORAL | 0 refills | Status: DC | PRN

## 2020-05-11 ENCOUNTER — Telehealth: Payer: Self-pay | Admitting: *Deleted

## 2020-05-11 NOTE — Telephone Encounter (Signed)
PA completed on CoverMyMeds and sent for review.  Awaiting response.

## 2020-05-11 NOTE — Telephone Encounter (Signed)
[facility] pharmacy tech with [employer] left a voicemail stating that they received a script for Ambien 5 mg. Margaret Short stated that the patient's benefit plan will only allow 1/2 a pill daily. If patient needs one a day a PA will need to be done.  PA telephone number [phone] Ref # [id number]

## 2020-05-13 NOTE — Telephone Encounter (Signed)
PA approved.  [facility] notified of approval via telephone.

## 2020-06-30 ENCOUNTER — Encounter: Payer: 59 | Admitting: Family Medicine

## 2020-07-03 ENCOUNTER — Ambulatory Visit: Payer: 59 | Admitting: Family Medicine

## 2020-07-09 ENCOUNTER — Encounter: Payer: Self-pay | Admitting: Family Medicine

## 2020-07-09 ENCOUNTER — Other Ambulatory Visit: Payer: Self-pay | Admitting: Family Medicine

## 2020-07-09 ENCOUNTER — Other Ambulatory Visit (HOSPITAL_COMMUNITY)
Admission: RE | Admit: 2020-07-09 | Discharge: 2020-07-09 | Disposition: A | Discharge status: Home / Self Care | Payer: 59 | Source: Ambulatory Visit | Attending: Family Medicine | Admitting: Family Medicine

## 2020-07-09 ENCOUNTER — Ambulatory Visit: Payer: 59 | Admitting: Family Medicine

## 2020-07-09 ENCOUNTER — Other Ambulatory Visit: Payer: Self-pay

## 2020-07-09 VITALS — BP 150/90 | HR 63 | Temp 96.9°F | Ht 66.25 in | Wt 172.8 lb

## 2020-07-09 DIAGNOSIS — F331 Major depressive disorder, recurrent, moderate: Secondary | ICD-10-CM | POA: Diagnosis not present

## 2020-07-09 DIAGNOSIS — N318 Other neuromuscular dysfunction of bladder: Secondary | ICD-10-CM | POA: Diagnosis not present

## 2020-07-09 DIAGNOSIS — Z13220 Encounter for screening for lipoid disorders: Secondary | ICD-10-CM | POA: Diagnosis not present

## 2020-07-09 DIAGNOSIS — G4700 Insomnia, unspecified: Secondary | ICD-10-CM

## 2020-07-09 DIAGNOSIS — Z0000 Encounter for general adult medical examination without abnormal findings: Secondary | ICD-10-CM | POA: Diagnosis not present

## 2020-07-09 DIAGNOSIS — Z124 Encounter for screening for malignant neoplasm of cervix: Secondary | ICD-10-CM | POA: Diagnosis present

## 2020-07-09 DIAGNOSIS — Z23 Encounter for immunization: Secondary | ICD-10-CM | POA: Diagnosis not present

## 2020-07-09 DIAGNOSIS — Z1231 Encounter for screening mammogram for malignant neoplasm of breast: Secondary | ICD-10-CM

## 2020-07-09 LAB — COMPREHENSIVE METABOLIC PANEL
ALT: 19 U/L (ref 0–35)
AST: 23 U/L (ref 0–37)
Albumin: 4.4 g/dL (ref 3.5–5.2)
Alkaline Phosphatase: 74 U/L (ref 39–117)
BUN: 12 mg/dL (ref 6–23)
CO2: 29 mEq/L (ref 19–32)
Calcium: 9.6 mg/dL (ref 8.4–10.5)
Chloride: 101 mEq/L (ref 96–112)
Creatinine, Ser: 0.75 mg/dL (ref 0.40–1.20)
GFR: 77.95 mL/min (ref >60.00)
Glucose, Bld: 90 mg/dL (ref 70–99)
Potassium: 4.2 mEq/L (ref 3.5–5.1)
Sodium: 138 mEq/L (ref 135–145)
Total Bilirubin: 0.4 mg/dL (ref 0.2–1.2)
Total Protein: 7.7 g/dL (ref 6.0–8.3)

## 2020-07-09 LAB — LIPID PANEL
Cholesterol: 230 mg/dL — ABNORMAL HIGH (ref 0–200)
HDL: 80.4 mg/dL (ref >39.00)
LDL Cholesterol: 132 mg/dL — ABNORMAL HIGH (ref 0–99)
NonHDL: 149.58
Total CHOL/HDL Ratio: 3
Triglycerides: 89 mg/dL (ref 0.0–149.0)
VLDL: 17.8 mg/dL (ref 0.0–40.0)

## 2020-07-09 NOTE — Progress Notes (Signed)
Chief Complaint  Patient presents with  . Annual Exam    History of Present Illness: HPI  The patient presents for complete physical and review of chronic health problems. He/She also has the following acute concerns today: None  She retired yesterday.  She plans to stay active.   Advance directives and end of life planning reviewed in detail with patient and documented in EMR. Patient given handout on advance care directives if needed. HCPOA and living will updated if needed.    Office Visit from 06/28/2019 in [facility] Total Score 0     MDD; insomnia stable control on current regimen.  Overactive bladder well controlled on Ditropan xl.  Reviewed labs in detail   Not taking multivitamin.  BP elevated today, not before... will follow at home.   Walking daily for 1 hours.  This visit occurred during the SARS-CoV-2 public health emergency.  Safety protocols were in place, including screening questions prior to the visit, additional usage of staff PPE, and extensive cleaning of exam room while observing appropriate contact time as indicated for disinfecting solutions.   COVID 19 screen:  No recent travel or known exposure to COVID19 The patient denies respiratory symptoms of COVID 19 at this time. The importance of social distancing was discussed today.     ROS    Past Medical History:  Diagnosis Date  . Depression   . Overactive bladder     reports that she quit smoking about 40 years ago. She has never used smokeless tobacco. She reports that she does not drink alcohol and does not use drugs.   Current Outpatient Medications:  .  oxybutynin (DITROPAN-XL) 5 MG 24 hr tablet, Take 1 tablet (5 mg total) by mouth every other day., Disp: 45 tablet, Rfl: 3 .  sertraline (ZOLOFT) 50 MG tablet, TAKE 1 TABLET DAILY, Disp: 90 tablet, Rfl: 1 .  zolpidem (AMBIEN) 5 MG tablet, Take 1 tablet (5 mg total) by mouth at bedtime as needed for sleep.,  Disp: 90 tablet, Rfl: 0   Observations/Objective: Blood pressure (!) 150/90, pulse 63, temperature (!) 96.9 F (36.1 C), temperature source Temporal, height 5' 6.25" (1.683 m), weight 172 lb 12 oz (78.4 kg), SpO2 97 %.  Wt Readings from Last 3 Encounters:  07/09/20 172 lb 12 oz (78.4 kg)  06/28/19 176 lb 8 oz (80.1 kg)  06/19/18 160 lb (72.6 kg)    Physical Exam Constitutional:      General: She is not in acute distress.    Appearance: Normal appearance. She is well-developed. She is not ill-appearing or toxic-appearing.  HENT:     Head: Normocephalic.     Right Ear: Hearing, tympanic membrane, ear canal and external ear normal.     Left Ear: Hearing, tympanic membrane, ear canal and external ear normal.     Nose: Nose normal.  Eyes:     General: Lids are normal. Lids are everted, no foreign bodies appreciated.     Conjunctiva/sclera: Conjunctivae normal.     Pupils: Pupils are equal, round, and reactive to light.  Neck:     Thyroid: No thyroid mass or thyromegaly.     Vascular: No carotid bruit.     Trachea: Trachea normal.  Cardiovascular:     Rate and Rhythm: Normal rate and regular rhythm.     Heart sounds: Normal heart sounds, S1 normal and S2 normal. No murmur heard.  No gallop.   Pulmonary:     Effort: Pulmonary  effort is normal. No respiratory distress.     Breath sounds: Normal breath sounds. No wheezing, rhonchi or rales.  Abdominal:     General: Bowel sounds are normal. There is no distension or abdominal bruit.     Palpations: Abdomen is soft. There is no fluid wave or mass.     Tenderness: There is no abdominal tenderness. There is no guarding or rebound.     Hernia: No hernia is present.  Genitourinary:    Exam position: Supine.     Labia:        Right: No rash, tenderness or lesion.        Left: No rash, tenderness or lesion.      Vagina: Normal.     Cervix: No cervical motion tenderness, discharge or friability.     Uterus: Not enlarged and not tender.       Adnexa:        Right: No mass, tenderness or fullness.         Left: No mass, tenderness or fullness.    Musculoskeletal:     Cervical back: Normal range of motion and neck supple.  Lymphadenopathy:     Cervical: No cervical adenopathy.  Skin:    General: Skin is warm and dry.     Findings: No rash.  Neurological:     Mental Status: She is alert.     Cranial Nerves: No cranial nerve deficit.     Sensory: No sensory deficit.  Psychiatric:        Mood and Affect: Mood is not anxious or depressed.        Speech: Speech normal.        Behavior: Behavior normal. Behavior is cooperative.        Judgment: Judgment normal.      Assessment and Plan   The patient's preventative maintenance and recommended screening tests for an annual wellness exam were reviewed in full today. Brought up to date unless services declined.  Counselled on the importance of diet, exercise, and its role in overall health and mortality. The patient's FH and SH was reviewed, including their home life, tobacco status, and drug and alcohol status.   Vaccines: Td uptodate,  Had flu 05/2020, Will start shingles vaccine Nonsmoker.  DVE/PAP: DVE not indicated, pap nml 2013, 2016 nml , neg co-testing, repeat in 5 years.  DONE today Denies need for STD testing. Mammo: Nml 06/2018, plan q2 years. DEXA: No family history of osteoporosis, low risk.  Start age 73. Colon:  IFOB  Positive  2019.. neg colonoscopy.. nml , repeat 10 years. HCV screen: neg   [name], MD

## 2020-07-09 NOTE — Patient Instructions (Addendum)
Please stop at the lab to have labs drawn.  Follow BP at home.Marland Kitchen goal < 140/90.  Call to set up mammogram.   Preventive Care 10-64 Years Old, Female Preventive care refers to visits with your health care provider and lifestyle choices that can promote health and wellness. This includes:  A yearly physical exam. This may also be called an annual well check.  Regular dental visits and eye exams.  Immunizations.  Screening for certain conditions.  Healthy lifestyle choices, such as eating a healthy diet, getting regular exercise, not using drugs or products that contain nicotine and tobacco, and limiting alcohol use. What can I expect for my preventive care visit? Physical exam Your health care provider will check your:  Height and weight. This may be used to calculate body mass index (BMI), which tells if you are at a healthy weight.  Heart rate and blood pressure.  Skin for abnormal spots. Counseling Your health care provider may ask you questions about your:  Alcohol, tobacco, and drug use.  Emotional well-being.  Home and relationship well-being.  Sexual activity.  Eating habits.  Work and work Statistician.  Method of birth control.  Menstrual cycle.  Pregnancy history. What immunizations do I need?  Influenza (flu) vaccine  This is recommended every year. Tetanus, diphtheria, and pertussis (Tdap) vaccine  You may need a Td booster every 10 years. Varicella (chickenpox) vaccine  You may need this if you have not been vaccinated. Zoster (shingles) vaccine  You may need this after age 26. Measles, mumps, and rubella (MMR) vaccine  You may need at least one dose of MMR if you were born in 1957 or later. You may also need a second dose. Pneumococcal conjugate (PCV13) vaccine  You may need this if you have certain conditions and were not previously vaccinated. Pneumococcal polysaccharide (PPSV23) vaccine  You may need one or two doses if you smoke  cigarettes or if you have certain conditions. Meningococcal conjugate (MenACWY) vaccine  You may need this if you have certain conditions. Hepatitis A vaccine  You may need this if you have certain conditions or if you travel or work in places where you may be exposed to hepatitis A. Hepatitis B vaccine  You may need this if you have certain conditions or if you travel or work in places where you may be exposed to hepatitis B. Haemophilus influenzae type b (Hib) vaccine  You may need this if you have certain conditions. Human papillomavirus (HPV) vaccine  If recommended by your health care provider, you may need three doses over 6 months. You may receive vaccines as individual doses or as more than one vaccine together in one shot (combination vaccines). Talk with your health care provider about the risks and benefits of combination vaccines. What tests do I need? Blood tests  Lipid and cholesterol levels. These may be checked every 5 years, or more frequently if you are over 73 years old.  Hepatitis C test.  Hepatitis B test. Screening  Lung cancer screening. You may have this screening every year starting at age 98 if you have a 30-pack-year history of smoking and currently smoke or have quit within the past 15 years.  Colorectal cancer screening. All adults should have this screening starting at age 85 and continuing until age 80. Your health care provider may recommend screening at age 30 if you are at increased risk. You will have tests every 1-10 years, depending on your results and the type of screening test.  Diabetes screening. This is done by checking your blood sugar (glucose) after you have not eaten for a while (fasting). You may have this done every 1-3 years.  Mammogram. This may be done every 1-2 years. Talk with your health care provider about when you should start having regular mammograms. This may depend on whether you have a family history of breast  cancer.  BRCA-related cancer screening. This may be done if you have a family history of breast, ovarian, tubal, or peritoneal cancers.  Pelvic exam and Pap test. This may be done every 3 years starting at age 21. Starting at age 30, this may be done every 5 years if you have a Pap test in combination with an HPV test. Other tests  Sexually transmitted disease (STD) testing.  Bone density scan. This is done to screen for osteoporosis. You may have this scan if you are at high risk for osteoporosis. Follow these instructions at home: Eating and drinking  Eat a diet that includes fresh fruits and vegetables, whole grains, lean protein, and low-fat dairy.  Take vitamin and mineral supplements as recommended by your health care provider.  Do not drink alcohol if: ? Your health care provider tells you not to drink. ? You are pregnant, may be pregnant, or are planning to become pregnant.  If you drink alcohol: ? Limit how much you have to 0-1 drink a day. ? Be aware of how much alcohol is in your drink. In the [place] one drink equals one 12 oz bottle of beer (355 mL), one 5 oz glass of wine (148 mL), or one 1 oz glass of hard liquor (44 mL). Lifestyle  Take daily care of your teeth and gums.  Stay active. Exercise for at least 30 minutes on 5 or more days each week.  Do not use any products that contain nicotine or tobacco, such as cigarettes, e-cigarettes, and chewing tobacco. If you need help quitting, ask your health care provider.  If you are sexually active, practice safe sex. Use a condom or other form of birth control (contraception) in order to prevent pregnancy and STIs (sexually transmitted infections).  If told by your health care provider, take low-dose aspirin daily starting at age 50. What's next?  Visit your health care provider once a year for a well check visit.  Ask your health care provider how often you should have your eyes and teeth checked.  Stay up to date  on all vaccines. This information is not intended to replace advice given to you by your health care provider. Make sure you discuss any questions you have with your health care provider. Document Revised: 06/07/2018 Document Reviewed: 06/07/2018 Elsevier Patient Education  2020 Elsevier Inc.  

## 2020-07-09 NOTE — Addendum Note (Signed)
Addended by: Damita Lack on: 07/09/2020 11:58 AM   Modules accepted: Orders

## 2020-07-10 LAB — CYTOLOGY - PAP
Comment: NEGATIVE
Diagnosis: NEGATIVE
High risk HPV: NEGATIVE

## 2020-07-28 NOTE — Telephone Encounter (Signed)
Please enter rx for me to fill as requested.

## 2020-07-29 NOTE — Addendum Note (Signed)
Addended by: Damita Lack on: 07/29/2020 02:37 PM   Modules accepted: Orders

## 2020-07-30 MED ORDER — OXYBUTYNIN CHLORIDE ER 5 MG PO TB24: 5.0000 mg | ORAL_TABLET | ORAL | 3 refills | Status: DC

## 2020-07-30 MED ORDER — ZOLPIDEM TARTRATE 5 MG PO TABS: 5.0000 mg | ORAL_TABLET | Freq: Every evening | ORAL | 0 refills | Status: DC | PRN

## 2020-07-30 MED ORDER — SERTRALINE HCL 50 MG PO TABS: 50.0000 mg | ORAL_TABLET | Freq: Every day | ORAL | 1 refills | Status: DC

## 2020-08-05 ENCOUNTER — Other Ambulatory Visit: Payer: Self-pay

## 2020-08-05 ENCOUNTER — Ambulatory Visit
Admission: RE | Admit: 2020-08-05 | Discharge: 2020-08-05 | Disposition: A | Discharge status: Home / Self Care | Payer: 59 | Source: Ambulatory Visit | Attending: Family Medicine | Admitting: Family Medicine

## 2020-08-05 DIAGNOSIS — Z1231 Encounter for screening mammogram for malignant neoplasm of breast: Secondary | ICD-10-CM | POA: Insufficient documentation

## 2020-09-01 ENCOUNTER — Telehealth: Payer: Self-pay | Admitting: Family Medicine

## 2020-09-01 ENCOUNTER — Other Ambulatory Visit
Admission: RE | Admit: 2020-09-01 | Discharge: 2020-09-01 | Disposition: A | Discharge status: Home / Self Care | Payer: 59 | Source: Ambulatory Visit | Attending: Occupational Medicine | Admitting: Occupational Medicine

## 2020-09-01 NOTE — Telephone Encounter (Signed)
Left message for Margaret Short, that her immunization record is ready to be picked up at the front desk. Tdap done 04/18/2014.

## 2020-09-01 NOTE — Telephone Encounter (Signed)
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 Please call when done [phone].

## 2020-09-02 LAB — MEASLES/MUMPS/RUBELLA IMMUNITY
Mumps IgG: 63.9 AU/mL (ref >10.9)
Rubella: 30.8 index (ref >0.99)
Rubeola IgG: >300 AU/mL (ref >16.4)

## 2020-09-02 LAB — VARICELLA ZOSTER ANTIBODY, IGG: Varicella IgG: >4000 index (ref >165)

## 2020-09-04 LAB — QUANTIFERON-TB GOLD PLUS (RQFGPL)
QuantiFERON Mitogen Value: 7.19 IU/mL
QuantiFERON Nil Value: 0.13 IU/mL
QuantiFERON TB1 Ag Value: 0.15 IU/mL
QuantiFERON TB2 Ag Value: 0.16 IU/mL

## 2020-09-04 LAB — QUANTIFERON-TB GOLD PLUS: QuantiFERON-TB Gold Plus: NEGATIVE

## 2020-10-14 ENCOUNTER — Other Ambulatory Visit: Payer: Self-pay | Admitting: *Deleted

## 2020-10-14 MED ORDER — SERTRALINE HCL 50 MG PO TABS
50.0000 mg | ORAL_TABLET | Freq: Every day | ORAL | 1 refills | Status: DC
Start: 2020-10-14 — End: 2021-03-12

## 2020-10-14 MED ORDER — OXYBUTYNIN CHLORIDE ER 5 MG PO TB24
5.0000 mg | ORAL_TABLET | ORAL | 2 refills | Status: DC
Start: 2020-10-14 — End: 2021-05-27

## 2020-10-14 NOTE — Telephone Encounter (Signed)
Last office visit 07/09/2020 for CPE.  Last refilled 07/30/2020 for #90 with no refills.  No future appointments.

## 2020-10-15 MED ORDER — ZOLPIDEM TARTRATE 5 MG PO TABS: 5.0000 mg | ORAL_TABLET | Freq: Every evening | ORAL | 0 refills | Status: DC | PRN

## 2021-03-11 ENCOUNTER — Other Ambulatory Visit: Payer: Self-pay | Admitting: Family Medicine

## 2021-03-12 ENCOUNTER — Other Ambulatory Visit: Payer: Self-pay | Admitting: Family Medicine

## 2021-03-12 NOTE — Telephone Encounter (Signed)
Pharmacy requests refill on: Sertraline 50 mg   LAST REFILL: 10/14/2020 (Q-90, R-1) LAST OV: 07/09/2020  NEXT OV: 07/13/2021 PHARMACY: [facility]

## 2021-03-14 NOTE — Telephone Encounter (Signed)
Last office visit 07/09/20 for CPE.  Last refilled 10/15/20 for #90 with no refills.  CPE scheduled 07/13/21.

## 2021-05-27 ENCOUNTER — Other Ambulatory Visit: Payer: Self-pay | Admitting: Family Medicine

## 2021-06-29 ENCOUNTER — Other Ambulatory Visit: Payer: Self-pay | Admitting: Family Medicine

## 2021-07-13 ENCOUNTER — Ambulatory Visit (INDEPENDENT_AMBULATORY_CARE_PROVIDER_SITE_OTHER): Payer: 59 | Admitting: Family Medicine

## 2021-07-13 ENCOUNTER — Other Ambulatory Visit: Payer: Self-pay

## 2021-07-13 VITALS — BP 130/78 | HR 81 | Temp 97.2°F | Ht 65.75 in | Wt 170.4 lb

## 2021-07-13 DIAGNOSIS — N318 Other neuromuscular dysfunction of bladder: Secondary | ICD-10-CM | POA: Diagnosis not present

## 2021-07-13 DIAGNOSIS — Z13220 Encounter for screening for lipoid disorders: Secondary | ICD-10-CM

## 2021-07-13 DIAGNOSIS — F331 Major depressive disorder, recurrent, moderate: Secondary | ICD-10-CM

## 2021-07-13 DIAGNOSIS — Z0000 Encounter for general adult medical examination without abnormal findings: Secondary | ICD-10-CM

## 2021-07-13 DIAGNOSIS — G4700 Insomnia, unspecified: Secondary | ICD-10-CM

## 2021-07-13 NOTE — Assessment & Plan Note (Addendum)
Stable, chronic.  Continue current medication.   Ambien 5 mg daily, Cannot decrease given OAB issues.

## 2021-07-13 NOTE — Progress Notes (Signed)
Patient ID: Margaret Short, female    DOB: October 26, 1955, 65 y.o.   MRN: [id number]  This visit was conducted in person.  BP 130/78   Pulse 81   Temp (!) 97.2 F (36.2 C) (Temporal)   Ht 5' 5.75" (1.67 m)   Wt 170 lb 6 oz (77.3 kg)   SpO2 97%   BMI 27.71 kg/m    CC: Chief Complaint  Patient presents with   Annual Exam    No concerns     Subjective:   HPI: [name] is a 65 y.o. female presenting on 07/13/2021 for Annual Exam (No concerns )   Due for lab evaluation... but she wishes to wait until next year.  Diet:  eat heart healthy diet. Exercise: She is now walking daily, using [employer].  MDD, insomnia: stable on current regimen. Sertraline 50 mg daily  Using Ambien at night for sleep.  OAB controlled on Ditropan XL every other day.     Relevant past medical, surgical, family and social history reviewed and updated as indicated. Interim medical history since our last visit reviewed. Allergies and medications reviewed and updated. Outpatient Medications Prior to Visit  Medication Sig Dispense Refill   oxybutynin (DITROPAN-XL) 5 MG 24 hr tablet TAKE 1 TABLET BY MOUTH EVERY OTHER DAY 45 tablet 0   sertraline (ZOLOFT) 50 MG tablet TAKE 1 TABLET BY MOUTH ONCE A DAY 90 tablet 1   zolpidem (AMBIEN) 5 MG tablet TAKE 1 TABLET BY MOUTH  QHD AS NEEDED FOR SLEEP 90 tablet 1   No facility-administered medications prior to visit.     Per HPI unless specifically indicated in ROS section below Review of Systems  Constitutional:  Negative for chills and fever.  HENT:  Negative for congestion and ear pain.   Eyes:  Negative for pain and redness.  Respiratory:  Negative for cough and shortness of breath.   Cardiovascular:  Negative for chest pain, palpitations and leg swelling.  Gastrointestinal:  Negative for abdominal pain, blood in stool, constipation, diarrhea, nausea and vomiting.  Genitourinary:  Negative for dysuria.  Musculoskeletal:  Negative for myalgias.   Skin:  Negative for rash.  Neurological:  Negative for dizziness.  Psychiatric/Behavioral:  The patient is not nervous/anxious.   Objective:  BP 130/78   Pulse 81   Temp (!) 97.2 F (36.2 C) (Temporal)   Ht 5' 5.75" (1.67 m)   Wt 170 lb 6 oz (77.3 kg)   SpO2 97%   BMI 27.71 kg/m   Wt Readings from Last 3 Encounters:  07/13/21 170 lb 6 oz (77.3 kg)  07/09/20 172 lb 12 oz (78.4 kg)  06/28/19 176 lb 8 oz (80.1 kg)      Physical Exam Vitals and nursing note reviewed.  Constitutional:      General: She is not in acute distress.    Appearance: Normal appearance. She is well-developed. She is not ill-appearing or toxic-appearing.  HENT:     Head: Normocephalic.     Right Ear: Hearing, tympanic membrane, ear canal and external ear normal.     Left Ear: Hearing, tympanic membrane, ear canal and external ear normal.     Nose: Nose normal.  Eyes:     General: Lids are normal. Lids are everted, no foreign bodies appreciated.     Conjunctiva/sclera: Conjunctivae normal.     Pupils: Pupils are equal, round, and reactive to light.  Neck:     Thyroid: No thyroid mass or thyromegaly.  Vascular: No carotid bruit.     Trachea: Trachea normal.  Cardiovascular:     Rate and Rhythm: Normal rate and regular rhythm.     Heart sounds: Normal heart sounds, S1 normal and S2 normal. No murmur heard.   No gallop.  Pulmonary:     Effort: Pulmonary effort is normal. No respiratory distress.     Breath sounds: Normal breath sounds. No wheezing, rhonchi or rales.  Abdominal:     General: Bowel sounds are normal. There is no distension or abdominal bruit.     Palpations: Abdomen is soft. There is no fluid wave or mass.     Tenderness: There is no abdominal tenderness. There is no guarding or rebound.     Hernia: No hernia is present.  Musculoskeletal:     Cervical back: Normal range of motion and neck supple.  Lymphadenopathy:     Cervical: No cervical adenopathy.  Skin:    General: Skin is  warm and dry.     Findings: No rash.  Neurological:     Mental Status: She is alert.     Cranial Nerves: No cranial nerve deficit.     Sensory: No sensory deficit.  Psychiatric:        Mood and Affect: Mood is not anxious or depressed.        Speech: Speech normal.        Behavior: Behavior normal. Behavior is cooperative.        Judgment: Judgment normal.      Results for orders placed or performed during the hospital encounter of 09/01/20  Measles/Mumps/Rubella Immunity  Result Value Ref Range   Rubella 30.80 Immune >0.99 index   Rubeola IgG >300.0 Immune >16.4 AU/mL   Mumps IgG 63.9 Immune >10.9 AU/mL  Varicella zoster antibody, IgG  Result Value Ref Range   Varicella IgG >4,000 Immune >165 index  QuantiFERON-TB Gold Plus  Result Value Ref Range   QuantiFERON Incubation Incubation performed.    QuantiFERON-TB Gold Plus Negative Negative  QuantiFERON-TB Gold Plus  Result Value Ref Range   QuantiFERON Criteria Comment    QuantiFERON TB1 Ag Value 0.15 IU/mL   QuantiFERON TB2 Ag Value 0.16 IU/mL   QuantiFERON Nil Value 0.13 IU/mL   QuantiFERON Mitogen Value 7.19 IU/mL    This visit occurred during the SARS-CoV-2 public health emergency.  Safety protocols were in place, including screening questions prior to the visit, additional usage of staff PPE, and extensive cleaning of exam room while observing appropriate contact time as indicated for disinfecting solutions.   COVID 19 screen:  No recent travel or known exposure to COVID19 The patient denies respiratory symptoms of COVID 19 at this time. The importance of social distancing was discussed today.   Assessment and Plan   The patient's preventative maintenance and recommended screening tests for an annual wellness exam were reviewed in full today. Brought up to date unless services declined.  Counselled on the importance of diet, exercise, and its role in overall health and mortality. The patient's FH and SH was  reviewed, including their home life, tobacco status, and drug and alcohol status.    Vaccines: Td uptodate,   given high dose flu , S/P shingrix, COVID x 4,  as well as bivalent booster Nonsmoker.   DVE/PAP: DVE not indicated, pap nml 07/09/2020 no further needed.  Denies need for STD testing. Mammo: Nml 07/2020 plan q2 years. DEXA: No family history of osteoporosis, low risk.   Start age 64. Colon:  IFOB  Positive, 2019.. neg colonoscopy.. nml, repeat 10 years.  HCV screen: neg  Problem List Items Addressed This Visit     INSOMNIA, CHRONIC    Stable, chronic.  Continue current medication.   Ambien 5 mg daily, Cannot decrease given OAB issues.      MDD (major depressive disorder), recurrent episode, moderate (HCC)    Stable, chronic.  Continue current medication.   Sertraline 50 mg daily      OVERACTIVE BLADDER    Stable, chronic.  Continue current medication.   Ditropan XL 5 mg every other day. Has SE if takes daily      Other Visit Diagnoses     Routine general medical examination at a health care facility    -  Primary   Screening cholesterol level           [name], MD

## 2021-07-13 NOTE — Assessment & Plan Note (Addendum)
Stable, chronic.  Continue current medication.   Ditropan XL 5 mg every other day. Has SE if takes daily

## 2021-07-13 NOTE — Assessment & Plan Note (Signed)
Stable, chronic.  Continue current medication.   Sertraline 50 mg daily 

## 2021-08-30 ENCOUNTER — Other Ambulatory Visit: Payer: Self-pay | Admitting: Family Medicine

## 2021-08-30 NOTE — Telephone Encounter (Signed)
Last office visit 07/13/2021 for CPE.  Last refilled 03/15/2021 for #90 with 1 refill.  No future appointment.

## 2021-11-11 ENCOUNTER — Other Ambulatory Visit: Payer: Self-pay | Admitting: Family Medicine

## 2021-11-25 ENCOUNTER — Other Ambulatory Visit: Payer: Self-pay | Admitting: Family Medicine

## 2021-12-14 ENCOUNTER — Encounter: Payer: Self-pay | Admitting: Family Medicine

## 2021-12-14 MED ORDER — OXYBUTYNIN CHLORIDE ER 5 MG PO TB24: 5.0000 mg | ORAL_TABLET | ORAL | 1 refills | Status: DC

## 2021-12-14 MED ORDER — SERTRALINE HCL 50 MG PO TABS: 50.0000 mg | ORAL_TABLET | Freq: Every day | ORAL | 1 refills | Status: DC

## 2021-12-14 MED ORDER — ZOLPIDEM TARTRATE 5 MG PO TABS: 5.0000 mg | ORAL_TABLET | Freq: Every evening | ORAL | 0 refills | Status: DC | PRN

## 2021-12-14 NOTE — Telephone Encounter (Signed)
Last office visit 07/13/2021 for CPE.  Last refilled 08/31/2021 for #90 with 1 refill.  Patient is changing pharmacies.  CPE scheduled for 07/14/2022. ?

## 2022-03-15 ENCOUNTER — Other Ambulatory Visit: Payer: Self-pay | Admitting: Family Medicine

## 2022-03-16 MED ORDER — ZOLPIDEM TARTRATE 5 MG PO TABS: 5.0000 mg | ORAL_TABLET | Freq: Every evening | ORAL | 0 refills | Status: DC | PRN

## 2022-03-16 NOTE — Telephone Encounter (Signed)
Refill request for zolpidem (AMBIEN) 5 MG tablet  LOV - 07/13/21 Next OV - 07/21/22 Last refill - 12/14/21 #90/0

## 2022-06-13 ENCOUNTER — Other Ambulatory Visit: Payer: Self-pay | Admitting: Family Medicine

## 2022-06-13 NOTE — Telephone Encounter (Signed)
Last office visit 07/13/21 for CPE.  Last refilled 03/16/22 for #90 with no refills.  CPE scheduled 07/21/22.Marland Kitchen

## 2022-06-14 ENCOUNTER — Other Ambulatory Visit: Payer: Self-pay | Admitting: *Deleted

## 2022-06-14 MED ORDER — OXYBUTYNIN CHLORIDE ER 5 MG PO TB24: 5.0000 mg | ORAL_TABLET | ORAL | 0 refills | Status: DC

## 2022-06-14 MED ORDER — SERTRALINE HCL 50 MG PO TABS: 50.0000 mg | ORAL_TABLET | Freq: Every day | ORAL | 0 refills | Status: DC

## 2022-06-29 ENCOUNTER — Encounter: Payer: Self-pay | Admitting: Family Medicine

## 2022-07-07 ENCOUNTER — Other Ambulatory Visit: Payer: Self-pay

## 2022-07-08 ENCOUNTER — Telehealth: Payer: Self-pay | Admitting: Family Medicine

## 2022-07-08 DIAGNOSIS — Z13220 Encounter for screening for lipoid disorders: Secondary | ICD-10-CM

## 2022-07-08 NOTE — Telephone Encounter (Signed)
-----   Message from Terri J Walsh sent at 06/29/2022  3:48 PM EDT ----- Regarding: Lab orders for Thursday, 10.5.23 Patient is scheduled for CPX labs, please order future labs, Thanks , Terri   

## 2022-07-14 ENCOUNTER — Other Ambulatory Visit (INDEPENDENT_AMBULATORY_CARE_PROVIDER_SITE_OTHER): Payer: Medicare Other

## 2022-07-14 ENCOUNTER — Encounter: Payer: Self-pay | Admitting: Family Medicine

## 2022-07-14 DIAGNOSIS — Z13220 Encounter for screening for lipoid disorders: Secondary | ICD-10-CM | POA: Diagnosis not present

## 2022-07-14 LAB — COMPREHENSIVE METABOLIC PANEL
ALT: 16 U/L (ref 0–35)
AST: 21 U/L (ref 0–37)
Albumin: 4.2 g/dL (ref 3.5–5.2)
Alkaline Phosphatase: 78 U/L (ref 39–117)
BUN: 16 mg/dL (ref 6–23)
CO2: 29 mEq/L (ref 19–32)
Calcium: 9.3 mg/dL (ref 8.4–10.5)
Chloride: 101 mEq/L (ref 96–112)
Creatinine, Ser: 0.81 mg/dL (ref 0.40–1.20)
GFR: 76.19 mL/min (ref >60.00)
Glucose, Bld: 90 mg/dL (ref 70–99)
Potassium: 4.3 mEq/L (ref 3.5–5.1)
Sodium: 136 mEq/L (ref 135–145)
Total Bilirubin: 0.5 mg/dL (ref 0.2–1.2)
Total Protein: 7.3 g/dL (ref 6.0–8.3)

## 2022-07-14 LAB — LIPID PANEL
Cholesterol: 234 mg/dL — ABNORMAL HIGH (ref 0–200)
HDL: 84.4 mg/dL (ref >39.00)
LDL Cholesterol: 132 mg/dL — ABNORMAL HIGH (ref 0–99)
NonHDL: 149.94
Total CHOL/HDL Ratio: 3
Triglycerides: 89 mg/dL (ref 0.0–149.0)
VLDL: 17.8 mg/dL (ref 0.0–40.0)

## 2022-07-14 NOTE — Progress Notes (Signed)
No critical labs need to be addressed urgently. We will discuss labs in detail at upcoming office visit.   

## 2022-07-21 ENCOUNTER — Ambulatory Visit (INDEPENDENT_AMBULATORY_CARE_PROVIDER_SITE_OTHER): Payer: Medicare Other | Admitting: Family Medicine

## 2022-07-21 ENCOUNTER — Encounter: Payer: Self-pay | Admitting: Family Medicine

## 2022-07-21 VITALS — BP 130/80 | HR 68 | Temp 98.6°F | Ht 66.0 in | Wt 170.5 lb

## 2022-07-21 DIAGNOSIS — Z23 Encounter for immunization: Secondary | ICD-10-CM | POA: Diagnosis not present

## 2022-07-21 DIAGNOSIS — G4700 Insomnia, unspecified: Secondary | ICD-10-CM | POA: Diagnosis not present

## 2022-07-21 DIAGNOSIS — F331 Major depressive disorder, recurrent, moderate: Secondary | ICD-10-CM

## 2022-07-21 DIAGNOSIS — E2839 Other primary ovarian failure: Secondary | ICD-10-CM | POA: Diagnosis not present

## 2022-07-21 DIAGNOSIS — N318 Other neuromuscular dysfunction of bladder: Secondary | ICD-10-CM

## 2022-07-21 DIAGNOSIS — Z0000 Encounter for general adult medical examination without abnormal findings: Secondary | ICD-10-CM

## 2022-07-21 NOTE — Assessment & Plan Note (Addendum)
Stable, chronic.  Continue current medication.   Ditropan 5 mg p.o.  Three days a week

## 2022-07-21 NOTE — Assessment & Plan Note (Signed)
Stable, chronic.  Continue current medication.   Ambien 5 mg p.o. bedtime

## 2022-07-21 NOTE — Patient Instructions (Signed)
Please call the location of your choice from the menu below to schedule your Mammogram and/or Bone Density appointment.    [place]   [facility]                      Phone:  [phone] [address]                               [place], [place] [address]                                                             Services: Traditional and 3D Mammogram, Bone Density   [facility]                 Phone: [phone] [address]                                                       [place], [place] [address]    Service: Bone Density ONLY   *this site does NOT perform mammograms  [facility]                        Phone:  [phone] [address]                                  [place], [place] [address]                                            Services:  3D Mammogram and Bone Density    [place]  [facility]   Phone:  [phone]   [address]                                                                            [place], [place] [address]                                            Services: 3D Mammogram and Bone Density  [facility] [place] ([facility])  Phone:  [phone]   [address]                        [place],  [address]                                                Services:  3D Mammogram and Bone Density  

## 2022-07-21 NOTE — Progress Notes (Signed)
Patient ID: Margaret Short, female    DOB: [birth year], 66 y.o.   MRN: [id number]  This visit was conducted in person.  BP 130/80   Pulse 68   Temp 98.6 F (37 C) (Oral)   Ht 5\' 6"  (1.676 m)   Wt 170 lb 8 oz (77.3 kg)   SpO2 97%   BMI 27.52 kg/m    CC:  Chief Complaint  Patient presents with   Welcome To Medicare    Subjective:   HPI: Margaret Short is a 66 y.o. female presenting on 07/21/2022 for Welcome To Medicare  The patient presents for welcome to  medicare wellness, complete physical and review of chronic health problems. He/She also has the following acute concerns today:  I have personally reviewed the Medicare Annual Wellness questionnaire and have noted 1. The patient's medical and social history 2. Their use of alcohol, tobacco or illicit drugs 3. Their current medications and supplements 4. The patient's functional ability including ADL's, fall risks, home safety risks and hearing or visual             impairment. 5. Diet and physical activities 6. Evidence for depression or mood disorders 7.         Updated provider list  Cognitive evaluation was performed and recorded on pt medicare questionnaire form. The patients weight, height, BMI and visual acuity have been recorded in the chart   I have made referrals, counseling and provided education to the patient based review of the above and I have provided the pt with a written personalized care plan for preventive services.   Documentation of this information was scanned into the electronic record under the media tab.    Advance directives and end of life planning reviewed in detail with patient and documented in EMR. Patient given handout on advance care directives if needed. HCPOA and living will updated if needed.  Hearing Screening  Method: Audiometry   500Hz  1000Hz  2000Hz  4000Hz   Right ear 20 20 20 20   Left ear 20 20 20 20    Vision Screening   Right eye Left eye Both eyes  Without correction  20/50 20/50 20/30   With correction       No falls in last 12 months.  MDD, insomnia: stable on current regimen. Sertraline 50 mg daily  Using Ambien at night for sleep. [facility] Visit from 07/21/2022 in [place] at [facility] Total Score 0        OAB controlled on Ditropan XL  three daya week.    No past EKG on record. Has had in past per pt, NSR.  Wt Readings from Last 3 Encounters:  07/21/22 170 lb 8 oz (77.3 kg)  07/13/21 170 lb 6 oz (77.3 kg)  07/09/20 172 lb 12 oz (78.4 kg)  Body mass index is 27.52 kg/m.   Diet:  moderate  Exercise: walking, strength training daily.  She is volunteering weekly.    Seeing Derm yearly.  [facility]. DrMilford Cage   Mother with Alzheimers, uncle as well. Age 75s.  Relevant past medical, surgical, family and social history reviewed and updated as indicated. Interim medical history since our last visit reviewed. Allergies and medications reviewed and updated. Outpatient Medications Prior to Visit  Medication Sig Dispense Refill   oxybutynin (DITROPAN-XL) 5 MG 24 hr tablet Take 1 tablet (5 mg total) by mouth every other day. 45 tablet 0   sertraline (ZOLOFT) 50 MG tablet Take 1 tablet (  50 mg total) by mouth daily. 90 tablet 0   zolpidem (AMBIEN) 5 MG tablet TAKE ONE TABLET BY MOUTH AT BEDTIME AS NEEDED FOR SLEEP 90 tablet 0   No facility-administered medications prior to visit.     Per HPI unless specifically indicated in ROS section below Review of Systems  Constitutional:  Negative for fatigue and fever.  HENT:  Negative for congestion.   Eyes:  Negative for pain.  Respiratory:  Negative for cough and shortness of breath.   Cardiovascular:  Negative for chest pain, palpitations and leg swelling.  Gastrointestinal:  Negative for abdominal pain.  Genitourinary:  Negative for dysuria and vaginal bleeding.  Musculoskeletal:  Negative for back pain.  Neurological:  Negative for syncope,  light-headedness and headaches.  Psychiatric/Behavioral:  Negative for dysphoric mood.    Objective:  BP 130/80   Pulse 68   Temp 98.6 F (37 C) (Oral)   Ht 5\' 6"  (1.676 m)   Wt 170 lb 8 oz (77.3 kg)   SpO2 97%   BMI 27.52 kg/m   Wt Readings from Last 3 Encounters:  07/21/22 170 lb 8 oz (77.3 kg)  07/13/21 170 lb 6 oz (77.3 kg)  07/09/20 172 lb 12 oz (78.4 kg)      Physical Exam Vitals and nursing note reviewed.  Constitutional:      General: She is not in acute distress.    Appearance: Normal appearance. She is well-developed. She is not ill-appearing or toxic-appearing.  HENT:     Head: Normocephalic.     Right Ear: Hearing, tympanic membrane, ear canal and external ear normal.     Left Ear: Hearing, tympanic membrane, ear canal and external ear normal.     Nose: Nose normal.  Eyes:     General: Lids are normal. Lids are everted, no foreign bodies appreciated.     Conjunctiva/sclera: Conjunctivae normal.     Pupils: Pupils are equal, round, and reactive to light.  Neck:     Thyroid: No thyroid mass or thyromegaly.     Vascular: No carotid bruit.     Trachea: Trachea normal.  Cardiovascular:     Rate and Rhythm: Normal rate and regular rhythm.     Heart sounds: Normal heart sounds, S1 normal and S2 normal. No murmur heard.    No gallop.  Pulmonary:     Effort: Pulmonary effort is normal. No respiratory distress.     Breath sounds: Normal breath sounds. No wheezing, rhonchi or rales.  Abdominal:     General: Bowel sounds are normal. There is no distension or abdominal bruit.     Palpations: Abdomen is soft. There is no fluid wave or mass.     Tenderness: There is no abdominal tenderness. There is no guarding or rebound.     Hernia: No hernia is present.  Musculoskeletal:     Cervical back: Normal range of motion and neck supple.  Lymphadenopathy:     Cervical: No cervical adenopathy.  Skin:    General: Skin is warm and dry.     Findings: No rash.  Neurological:      Mental Status: She is alert.     Cranial Nerves: No cranial nerve deficit.     Sensory: No sensory deficit.  Psychiatric:        Mood and Affect: Mood is not anxious or depressed.        Speech: Speech normal.        Behavior: Behavior normal. Behavior is cooperative.  Judgment: Judgment normal.       Results for orders placed or performed in visit on 07/14/22  Comprehensive metabolic panel  Result Value Ref Range   Sodium 136 135 - 145 mEq/L   Potassium 4.3 3.5 - 5.1 mEq/L   Chloride 101 96 - 112 mEq/L   CO2 29 19 - 32 mEq/L   Glucose, Bld 90 70 - 99 mg/dL   BUN 16 6 - 23 mg/dL   Creatinine, Ser 6.43 0.40 - 1.20 mg/dL   Total Bilirubin 0.5 0.2 - 1.2 mg/dL   Alkaline Phosphatase 78 39 - 117 U/L   AST 21 0 - 37 U/L   ALT 16 0 - 35 U/L   Total Protein 7.3 6.0 - 8.3 g/dL   Albumin 4.2 3.5 - 5.2 g/dL   GFR 32.95 >18.84 mL/min   Calcium 9.3 8.4 - 10.5 mg/dL  Lipid panel  Result Value Ref Range   Cholesterol 234 (H) 0 - 200 mg/dL   Triglycerides 16.6 0.0 - 149.0 mg/dL   HDL 06.30 >16.01 mg/dL   VLDL 09.3 0.0 - 23.5 mg/dL   LDL Cholesterol 573 (H) 0 - 99 mg/dL   Total CHOL/HDL Ratio 3    NonHDL 149.94      COVID 19 screen:  No recent travel or known exposure to COVID19 The patient denies respiratory symptoms of COVID 19 at this time. The importance of social distancing was discussed today.   Assessment and Plan The patient's preventative maintenance and recommended screening tests for an annual wellness exam were reviewed in full today. Brought up to date unless services declined.  Counselled on the importance of diet, exercise, and its role in overall health and mortality. The patient's FH and SH was reviewed, including their home life, tobacco status, and drug and alcohol status.   Vaccines: Td uptodate,   h S/P shingrix, high dose flu ,COVID x 6, most recent 06/29/22  Given prevnar 20 today Nonsmoker.   DVE/PAP: DVE not indicated, pap nml 07/09/2020 no  further needed.  Denies need for STD testing. Mammo: Nml 07/2020 plan q2 years. DUE DEXA: No family history of osteoporosis, low risk.   Start age 21... due Colon:  IFOB  Positive, 2019.. neg colonoscopy.. nml, repeat 10 years.  HCV screen: neg   ETOH 1 drink in a year   Problem List Items Addressed This Visit     INSOMNIA, CHRONIC    Stable, chronic.  Continue current medication.   Ambien 5 mg p.o. bedtime      MDD (major depressive disorder), recurrent episode, moderate (HCC)    Stable, chronic.  Continue current medication.  Sertraline 50 mg p.o. daily       OVERACTIVE BLADDER    Stable, chronic.  Continue current medication.   Ditropan 5 mg p.o.  Three days a week      Other Visit Diagnoses     Welcome to Medicare preventive visit    -  Primary   Need for vaccination against Streptococcus pneumoniae       Relevant Orders   Pneumococcal conjugate vaccine 20-valent (Prevnar 20) (Completed)   Estrogen deficiency       Relevant Orders   DG Bone Density       [name], MD

## 2022-07-21 NOTE — Assessment & Plan Note (Signed)
Stable, chronic.  Continue current medication.  Sertraline 50 mg p.o. daily

## 2022-07-22 ENCOUNTER — Other Ambulatory Visit: Payer: Self-pay | Admitting: Family Medicine

## 2022-07-22 DIAGNOSIS — Z1231 Encounter for screening mammogram for malignant neoplasm of breast: Secondary | ICD-10-CM

## 2022-09-03 ENCOUNTER — Other Ambulatory Visit: Payer: Self-pay | Admitting: Family Medicine

## 2022-09-04 MED ORDER — SERTRALINE HCL 50 MG PO TABS: 50.0000 mg | ORAL_TABLET | Freq: Every day | ORAL | 1 refills | Status: DC

## 2022-09-04 NOTE — Telephone Encounter (Signed)
Last office visit 07/21/22 for Welcome to Medicare.  Last refilled 06/14/22 for #90 with no refills.  No future appointments.

## 2022-09-06 MED ORDER — ZOLPIDEM TARTRATE 5 MG PO TABS: 5.0000 mg | ORAL_TABLET | Freq: Every evening | ORAL | 0 refills | Status: DC | PRN

## 2022-09-27 ENCOUNTER — Ambulatory Visit
Admission: RE | Admit: 2022-09-27 | Discharge: 2022-09-27 | Disposition: A | Discharge status: Home / Self Care | Payer: Medicare Other | Source: Ambulatory Visit | Attending: Family Medicine | Admitting: Family Medicine

## 2022-09-27 DIAGNOSIS — Z1231 Encounter for screening mammogram for malignant neoplasm of breast: Secondary | ICD-10-CM | POA: Diagnosis present

## 2022-09-27 DIAGNOSIS — E2839 Other primary ovarian failure: Secondary | ICD-10-CM | POA: Insufficient documentation

## 2022-09-28 NOTE — Progress Notes (Signed)
No critical labs need to be addressed urgently. We will discuss labs in detail at upcoming office visit.   

## 2022-12-12 ENCOUNTER — Other Ambulatory Visit: Payer: Self-pay | Admitting: Family Medicine

## 2022-12-12 NOTE — Telephone Encounter (Signed)
Last office visit 07/21/22 for Welcome to Medicare.  Last refilled 09/06/22 for #90 with no refills.  Next Appt: 07/25/2023 for CPE.

## 2023-03-11 ENCOUNTER — Other Ambulatory Visit: Payer: Self-pay | Admitting: Family Medicine

## 2023-03-15 ENCOUNTER — Other Ambulatory Visit: Payer: Self-pay | Admitting: Family Medicine

## 2023-03-15 NOTE — Telephone Encounter (Signed)
Duplicate request for zolpidem 

## 2023-03-16 ENCOUNTER — Other Ambulatory Visit: Payer: Self-pay | Admitting: Family Medicine

## 2023-03-16 MED ORDER — ZOLPIDEM TARTRATE 5 MG PO TABS: 5.0000 mg | ORAL_TABLET | Freq: Every evening | ORAL | 0 refills | Status: DC | PRN

## 2023-06-12 ENCOUNTER — Other Ambulatory Visit: Payer: Self-pay | Admitting: Family Medicine

## 2023-06-13 MED ORDER — ZOLPIDEM TARTRATE 5 MG PO TABS: 5.0000 mg | ORAL_TABLET | Freq: Every evening | ORAL | 0 refills | Status: DC | PRN

## 2023-06-13 NOTE — Telephone Encounter (Signed)
Last office visit 07/21/22 for Welcome to Medicare.  Last refilled 03/16/2023 for #90 with no refills.  Next Appt; CPE 07/25/2023

## 2023-07-18 ENCOUNTER — Other Ambulatory Visit: Payer: Medicare Other

## 2023-07-24 ENCOUNTER — Ambulatory Visit (INDEPENDENT_AMBULATORY_CARE_PROVIDER_SITE_OTHER): Payer: Medicare Other

## 2023-07-24 VITALS — Ht 66.0 in | Wt 171.0 lb

## 2023-07-24 DIAGNOSIS — Z0000 Encounter for general adult medical examination without abnormal findings: Secondary | ICD-10-CM

## 2023-07-24 NOTE — Progress Notes (Signed)
Subjective:   Margaret Short is a 67 y.o. female who presents for an Initial Medicare Annual Wellness Visit.  Visit Complete: Virtual I connected with  EMEREE MAHLER on 07/24/23 by a audio enabled telemedicine application and verified that I am speaking with the correct person using two identifiers.  Patient Location: Home  Provider Location: Home Office  I discussed the limitations of evaluation and management by telemedicine. The patient expressed understanding and agreed to proceed.  Vital Signs: Because this visit was a virtual/telehealth visit, some criteria may be missing or patient reported. Any vitals not documented were not able to be obtained and vitals that have been documented are patient reported.  Patient Medicare AWV questionnaire was completed by the patient on 07/24/2023; I have confirmed that all information answered by patient is correct and no changes since this date.  Cardiac Risk Factors include: advanced age (>22men, >85 women)     Objective:    Today's Vitals   07/24/23 1430  Weight: 171 lb (77.6 kg)  Height: 5\' 6"  (1.676 m)   Body mass index is 27.6 kg/m.     07/24/2023    2:32 PM 06/19/2018    7:11 AM 05/16/2016    2:06 PM  Advanced Directives  Does Patient Have a Medical Advance Directive? Yes Yes Yes  Type of Estate agent of [place];Living will Living will Healthcare Power of [place];Living will  Does patient want to make changes to medical advance directive?   No - Patient declined  Copy of Healthcare Power of Attorney in Chart? No - copy requested  No - copy requested    Current Medications (verified) Outpatient Encounter Medications as of 07/24/2023  Medication Sig   oxybutynin (DITROPAN-XL) 5 MG 24 hr tablet TAKE ONE TABLET BY MOUTH EVERY OTHER DAY   sertraline (ZOLOFT) 50 MG tablet TAKE ONE TABLET BY MOUTH ONE TIME DAILY   zolpidem (AMBIEN) 5 MG tablet Take 1 tablet (5 mg total) by mouth at bedtime as needed.  for sleep   No facility-administered encounter medications on file as of 07/24/2023.    Allergies (verified) Patient has no known allergies.   History: Past Medical History:  Diagnosis Date   Depression    Overactive bladder    Past Surgical History:  Procedure Laterality Date   COLONOSCOPY WITH PROPOFOL N/A 06/19/2018   Procedure: COLONOSCOPY WITH PROPOFOL;  Surgeon: Wyline Mood, MD;  Location: [facility] ENDOSCOPY;  Service: Gastroenterology;  Laterality: N/A;   CORNEAL TRANSPLANT Right    Keraplasty  1996   LAMELLAR KERATOPLASTY  1985   partial   Family History  Problem Relation Age of Onset   Cancer Father        oral cancer due to tobacco use   Hypertension Mother    Osteoarthritis Mother    Healthy Brother    Healthy Brother    Healthy Brother    Multiple sclerosis Paternal Aunt    Breast cancer Neg Hx    Social History   Socioeconomic History   Marital status: Single    Spouse name: Not on file   Number of children: Not on file   Years of education: Not on file   Highest education level: Not on file  Occupational History   Occupation: Professor    Employer: [employer]  Tobacco Use   Smoking status: Former    Current packs/day: 0.00    Types: Cigarettes    Quit date: 1981    Years since quitting: 43.8  Smokeless tobacco: Never  Substance and Sexual Activity   Alcohol use: No   Drug use: No   Sexual activity: Never  Other Topics Concern   Not on file  Social History Narrative   Regular exercise:yes, daily treadmill   Diet: veggies, water minimal          Social Determinants of Health   Financial Resource Strain: Low Risk  (07/24/2023)   Overall Financial Resource Strain (CARDIA)    Difficulty of Paying Living Expenses: Not hard at all  Food Insecurity: No Food Insecurity (07/24/2023)   Hunger Vital Sign    Worried About Running Out of Food in the Last Year: Never true    Ran Out of Food in the Last Year: Never true  Transportation Needs:  No Transportation Needs (07/24/2023)   PRAPARE - Administrator, Civil Service (Medical): No    Lack of Transportation (Non-Medical): No  Physical Activity: Sufficiently Active (07/24/2023)   Exercise Vital Sign    Days of Exercise per Week: 7 days    Minutes of Exercise per Session: 30 min  Stress: No Stress Concern Present (07/24/2023)   [employer] of Occupational Health - Occupational Stress Questionnaire    Feeling of Stress : Not at all  Social Connections: Moderately Isolated (07/24/2023)   Social Connection and Isolation Panel [NHANES]    Frequency of Communication with Friends and Family: More than three times a week    Frequency of Social Gatherings with Friends and Family: More than three times a week    Attends Religious Services: Never    Database administrator or Organizations: Yes    Attends Engineer, structural: More than 4 times per year    Marital Status: Never married    Tobacco Counseling Counseling given: Not Answered   Clinical Intake:  Pre-visit preparation completed: Yes  Pain : No/denies pain     Nutritional Risks: None Diabetes: No  How often do you need to have someone help you when you read instructions, pamphlets, or other written materials from your doctor or pharmacy?: 1 - Never  Interpreter Needed?: No  Information entered by :: Margaret Ora, Margaret Short   Activities of Daily Living    07/24/2023    2:33 PM 07/19/2023    8:57 AM  In your present state of health, do you have any difficulty performing the following activities:  Hearing? 0 0  Vision? 0 0  Difficulty concentrating or making decisions? 0 0  Walking or climbing stairs? 0 0  Dressing or bathing? 0 0  Doing errands, shopping? 0 0  Preparing Food and eating ? N N  Using the Toilet? N N  In the past six months, have you accidently leaked urine? N N  Do you have problems with loss of bowel control? N N  Managing your Medications? N N  Managing your  Finances? N N  Housekeeping or managing your Housekeeping? N N    Patient Care Team: Excell Seltzer, MD as PCP - General (Family Medicine)  Indicate any recent Medical Services you may have received from other than [facility] providers in the past year (date may be approximate).     Assessment:   This is a routine wellness examination for Margaret Short.  Hearing/Vision screen Vision Screening - Comments:: Wears rx glasses - up to date with routine eye exams with  Dr.Portfillo   Goals Addressed             This Visit's Progress  DIET - INCREASE WATER INTAKE         Depression Screen    07/24/2023    2:31 PM 07/21/2022    8:48 AM 07/13/2021    2:37 PM 06/28/2019    3:42 PM 02/26/2019    8:12 AM 05/22/2018    4:02 PM 01/25/2018   12:18 PM  PHQ 2/9 Scores  PHQ - 2 Score 0 0 0 0 0 0 6  PHQ- 9 Score  0 0  0  18    Fall Risk    07/24/2023    2:30 PM 07/19/2023    8:57 AM 07/21/2022    8:37 AM  Fall Risk   Falls in the past year? 0 0 0  Number falls in past yr: 0    Injury with Fall? 0    Risk for fall due to : No Fall Risks    Follow up Falls prevention discussed      MEDICARE RISK AT HOME: Medicare Risk at Home Any stairs in or around the home?: Yes If so, are there any without handrails?: No Home free of loose throw rugs in walkways, pet beds, electrical cords, etc?: Yes Adequate lighting in your home to reduce risk of falls?: Yes Life alert?: No Use of a cane, walker or w/c?: No Grab bars in the bathroom?: Yes Shower chair or bench in shower?: Yes Elevated toilet seat or a handicapped toilet?: Yes  TIMED UP AND GO:  Was the test performed? No    Cognitive Function:        07/24/2023    2:33 PM  6CIT Screen  What Year? 0 points  What month? 0 points  What time? 0 points  Count back from 20 0 points  Months in reverse 0 points  Repeat phrase 0 points  Total Score 0 points    Immunizations Immunization History  Administered Date(s) Administered    Fluad Quad(high Dose 65+) 06/26/2022   Fluad Trivalent(High Dose 65+) 06/20/2023   Influenza Inj Mdck Quad Pf 06/12/2021   Influenza Whole 07/10/2010   Influenza, Seasonal, Injecte, Preservative Fre 08/10/2014   Influenza,inj,Quad PF,6+ Mos 06/05/2018, 06/07/2019   Influenza-Unspecified 07/11/2016, 06/20/2017, 06/08/2020   Moderna Covid-19 Fall Seasonal Vaccine 6yrs & older 06/20/2023   Moderna SARS-COV2 Booster Vaccination 06/12/2021, 06/29/2022   Moderna Sars-Covid-2 Vaccination 12/20/2019, 01/18/2020, 08/09/2020, 01/07/2021   PNEUMOCOCCAL CONJUGATE-20 07/21/2022   Td 10/11/2003   Tdap 04/18/2014   Zoster Recombinant(Shingrix) 07/09/2020, 09/07/2020    TDAP status: Up to date  Flu Vaccine status: Up to date  Pneumococcal vaccine status: Up to date  Covid-19 vaccine status: Completed vaccines  Qualifies for Shingles Vaccine? Yes   Zostavax completed Yes   Shingrix Completed?: Yes  Screening Tests Health Maintenance  Topic Date Due   COVID-19 Vaccine (8 - 2023-24 season) 10/20/2023   DTaP/Tdap/Td (3 - Td or Tdap) 04/18/2024   Medicare Annual Wellness (AWV)  07/23/2024   MAMMOGRAM  09/27/2024   Pneumonia Vaccine 49+ Years old  Completed   INFLUENZA VACCINE  Completed   DEXA SCAN  Completed   Hepatitis C Screening  Completed   Zoster Vaccines- Shingrix  Completed   HPV VACCINES  Aged Out    Health Maintenance  There are no preventive care reminders to display for this patient.   Colorectal cancer screening: Type of screening: Colonoscopy. Completed 06/19/2018. Repeat every 10 years  Mammogram status: Completed 09/27/2022. Repeat every year  Bone Density status: Completed 09/27/2022. Results reflect: Bone density results: OSTEOPENIA. Repeat  every 5 years.  Lung Cancer Screening: (Low Dose CT Chest recommended if Age 21-80 years, 20 pack-year currently smoking OR have quit w/in 15years.) does not qualify.   Lung Cancer Screening Referral: n/a  Additional  Screening:  Hepatitis C Screening: does not qualify; Completed 05/27/2015  Vision Screening: Recommended annual ophthalmology exams for early detection of glaucoma and other disorders of the eye. Is the patient up to date with their annual eye exam?  Yes  Who is the provider or what is the name of the office in which the patient attends annual eye exams? Dr.Portfillio If pt is not established with a provider, would they like to be referred to a provider to establish care? No .   Dental Screening: Recommended annual dental exams for proper oral hygiene   Community Resource Referral / Chronic Care Management: CRR required this visit?  No   CCM required this visit?  No     Plan:     I have personally reviewed and noted the following in the patient's chart:   Medical and social history Use of alcohol, tobacco or illicit drugs  Current medications and supplements including opioid prescriptions. Patient is not currently taking opioid prescriptions. Functional ability and status Nutritional status Physical activity Advanced directives List of other physicians Hospitalizations, surgeries, and ER visits in previous 12 months Vitals Screenings to include cognitive, depression, and falls Referrals and appointments  In addition, I have reviewed and discussed with patient certain preventive protocols, quality metrics, and best practice recommendations. A written personalized care plan for preventive services as well as general preventive health recommendations were provided to patient.     Lorrene Reid, Margaret Short   16/07/[id number]   After Visit Summary: (MyChart) Due to this being a telephonic visit, the after visit summary with patients personalized plan was offered to patient via MyChart   Nurse Notes: none

## 2023-07-24 NOTE — Patient Instructions (Signed)
Margaret Short , Thank you for taking time to come for your Medicare Wellness Visit. I appreciate your ongoing commitment to your health goals. Please review the following plan we discussed and let me know if I can assist you in the future.   Referrals/Orders/Follow-Ups/Clinician Recommendations: Aim for 30 minutes of exercise or brisk walking, 6-8 glasses of water, and 5 servings of fruits and vegetables each day.   This is a list of the screening recommended for you and due dates:  Health Maintenance  Topic Date Due   COVID-19 Vaccine (8 - 2023-24 season) 10/20/2023   DTaP/Tdap/Td vaccine (3 - Td or Tdap) 04/18/2024   Medicare Annual Wellness Visit  07/23/2024   Mammogram  09/27/2024   Pneumonia Vaccine  Completed   Flu Shot  Completed   DEXA scan (bone density measurement)  Completed   Hepatitis C Screening  Completed   Zoster (Shingles) Vaccine  Completed   HPV Vaccine  Aged Out    Advanced directives: (Copy Requested) Please bring a copy of your health care power of attorney and living will to the office to be added to your chart at your convenience.  Next Medicare Annual Wellness Visit scheduled for next year: Yes  Insert Preventive Care attachment Insert FALL PREVENTION attachment if needed

## 2023-07-25 ENCOUNTER — Encounter: Payer: Self-pay | Admitting: Family Medicine

## 2023-07-25 ENCOUNTER — Ambulatory Visit (INDEPENDENT_AMBULATORY_CARE_PROVIDER_SITE_OTHER): Payer: Medicare Other | Admitting: Family Medicine

## 2023-07-25 VITALS — BP 136/76 | HR 76 | Temp 98.8°F | Ht 66.0 in | Wt 174.5 lb

## 2023-07-25 DIAGNOSIS — F331 Major depressive disorder, recurrent, moderate: Secondary | ICD-10-CM | POA: Diagnosis not present

## 2023-07-25 DIAGNOSIS — Z0000 Encounter for general adult medical examination without abnormal findings: Secondary | ICD-10-CM | POA: Diagnosis not present

## 2023-07-25 NOTE — Assessment & Plan Note (Signed)
Stable, chronic.  Continue current medication.  Sertraline 50 mg p.o. daily

## 2023-07-25 NOTE — Progress Notes (Signed)
Patient ID: ABELINA KETRON, female    DOB: [birth year], 67 y.o.   MRN: [id number]  This visit was conducted in person.  BP (!) 140/80 (BP Location: Left Arm, Patient Position: Sitting, Cuff Size: Large)   Pulse 76   Temp 98.8 F (37.1 C) (Temporal)   Ht 5\' 6"  (1.676 m)   Wt 174 lb 8 oz (79.2 kg)   SpO2 98%   BMI 28.17 kg/m    CC:  Chief Complaint  Patient presents with   Annual Exam    Part 2    Subjective:   HPI: JOHNI NARINE is a 67 y.o. female presenting on 07/25/2023 for Annual Exam (Part 2)  The patient presents for  complete physical and review of chronic health problems. He/She also has the following acute concerns today:  The patient saw a LPN or RN for medicare wellness visit.  Prevention and wellness was reviewed in detail. Note reviewed and important notes copied below.  No falls in last 12 months.  MDD, insomnia: stable on current regimen. Sertraline 50 mg daily  Using Ambien at night for sleep. Flowsheet Row Office Visit from 07/25/2023 in [facility]  PHQ-2 Total Score 0        OAB controlled on Ditropan XL  three day a week.     Wt Readings from Last 3 Encounters:  07/25/23 174 lb 8 oz (79.2 kg)  07/24/23 171 lb (77.6 kg)  07/21/22 170 lb 8 oz (77.3 kg)  Body mass index is 28.17 kg/m.   Diet:  moderate  Exercise: walking, strength training daily.  She is volunteering weekly.  At home BPs 13/74... white coat HTN in past.   Seeing Derm yearly.  [facility]. Dr. Cheree Ditto   Mother with Alzheimers, uncle as well. Age 30s.  The 10-year ASCVD risk score (Arnett DK, et al., 2019) is: 6.7%   Values used to calculate the score:     Age: 89 years     Sex: Female     Is Non-Hispanic African American: No     Diabetic: No     Tobacco smoker: No     Systolic Blood Pressure: 140 mmHg     Is BP treated: No     HDL Cholesterol: 84.4 mg/dL     Total Cholesterol: 234 mg/dL  Relevant past medical,  surgical, family and social history reviewed and updated as indicated. Interim medical history since our last visit reviewed. Allergies and medications reviewed and updated. Outpatient Medications Prior to Visit  Medication Sig Dispense Refill   oxybutynin (DITROPAN-XL) 5 MG 24 hr tablet TAKE ONE TABLET BY MOUTH EVERY OTHER DAY 45 tablet 1   sertraline (ZOLOFT) 50 MG tablet TAKE ONE TABLET BY MOUTH ONE TIME DAILY 90 tablet 1   zolpidem (AMBIEN) 5 MG tablet Take 1 tablet (5 mg total) by mouth at bedtime as needed. for sleep 90 tablet 0   No facility-administered medications prior to visit.     Per HPI unless specifically indicated in ROS section below Review of Systems  Constitutional:  Negative for fatigue and fever.  HENT:  Negative for congestion.   Eyes:  Negative for pain.  Respiratory:  Negative for cough and shortness of breath.   Cardiovascular:  Negative for chest pain, palpitations and leg swelling.  Gastrointestinal:  Negative for abdominal pain.  Genitourinary:  Negative for dysuria and vaginal bleeding.  Musculoskeletal:  Negative for back pain.  Neurological:  Negative for  syncope, light-headedness and headaches.  Psychiatric/Behavioral:  Negative for dysphoric mood.    Objective:  BP (!) 140/80 (BP Location: Left Arm, Patient Position: Sitting, Cuff Size: Large)   Pulse 76   Temp 98.8 F (37.1 C) (Temporal)   Ht 5\' 6"  (1.676 m)   Wt 174 lb 8 oz (79.2 kg)   SpO2 98%   BMI 28.17 kg/m   Wt Readings from Last 3 Encounters:  07/25/23 174 lb 8 oz (79.2 kg)  07/24/23 171 lb (77.6 kg)  07/21/22 170 lb 8 oz (77.3 kg)      Physical Exam Vitals and nursing note reviewed.  Constitutional:      General: She is not in acute distress.    Appearance: Normal appearance. She is well-developed. She is not ill-appearing or toxic-appearing.  HENT:     Head: Normocephalic.     Right Ear: Hearing, tympanic membrane, ear canal and external ear normal.     Left Ear: Hearing,  tympanic membrane, ear canal and external ear normal.     Nose: Nose normal.  Eyes:     General: Lids are normal. Lids are everted, no foreign bodies appreciated.     Conjunctiva/sclera: Conjunctivae normal.     Pupils: Pupils are equal, round, and reactive to light.  Neck:     Thyroid: No thyroid mass or thyromegaly.     Vascular: No carotid bruit.     Trachea: Trachea normal.  Cardiovascular:     Rate and Rhythm: Normal rate and regular rhythm.     Heart sounds: Normal heart sounds, S1 normal and S2 normal. No murmur heard.    No gallop.  Pulmonary:     Effort: Pulmonary effort is normal. No respiratory distress.     Breath sounds: Normal breath sounds. No wheezing, rhonchi or rales.  Abdominal:     General: Bowel sounds are normal. There is no distension or abdominal bruit.     Palpations: Abdomen is soft. There is no fluid wave or mass.     Tenderness: There is no abdominal tenderness. There is no guarding or rebound.     Hernia: No hernia is present.  Musculoskeletal:     Cervical back: Normal range of motion and neck supple.  Lymphadenopathy:     Cervical: No cervical adenopathy.  Skin:    General: Skin is warm and dry.     Findings: No rash.  Neurological:     Mental Status: She is alert.     Cranial Nerves: No cranial nerve deficit.     Sensory: No sensory deficit.  Psychiatric:        Mood and Affect: Mood is not anxious or depressed.        Speech: Speech normal.        Behavior: Behavior normal. Behavior is cooperative.        Judgment: Judgment normal.       Results for orders placed or performed in visit on 07/14/22  Comprehensive metabolic panel  Result Value Ref Range   Sodium 136 135 - 145 mEq/L   Potassium 4.3 3.5 - 5.1 mEq/L   Chloride 101 96 - 112 mEq/L   CO2 29 19 - 32 mEq/L   Glucose, Bld 90 70 - 99 mg/dL   BUN 16 6 - 23 mg/dL   Creatinine, Ser 7.82 0.40 - 1.20 mg/dL   Total Bilirubin 0.5 0.2 - 1.2 mg/dL   Alkaline Phosphatase 78 39 - 117 U/L    AST 21 0 -  37 U/L   ALT 16 0 - 35 U/L   Total Protein 7.3 6.0 - 8.3 g/dL   Albumin 4.2 3.5 - 5.2 g/dL   GFR 29.56 >21.30 mL/min   Calcium 9.3 8.4 - 10.5 mg/dL  Lipid panel  Result Value Ref Range   Cholesterol 234 (H) 0 - 200 mg/dL   Triglycerides 86.5 0.0 - 149.0 mg/dL   HDL 78.46 >96.29 mg/dL   VLDL 52.8 0.0 - 41.3 mg/dL   LDL Cholesterol 244 (H) 0 - 99 mg/dL   Total CHOL/HDL Ratio 3    NonHDL 149.94      COVID 19 screen:  No recent travel or known exposure to COVID19 The patient denies respiratory symptoms of COVID 19 at this time. The importance of social distancing was discussed today.   Assessment and Plan The patient's preventative maintenance and recommended screening tests for an annual wellness exam were reviewed in full today. Brought up to date unless services declined.  Counselled on the importance of diet, exercise, and its role in overall health and mortality. The patient's FH and SH was reviewed, including their home life, tobacco status, and drug and alcohol status.   Vaccines: Td uptodate,   S/P shingrix, high dose flu ,COVID, prevnar 20  Nonsmoker.   DVE/PAP: DVE not indicated, pap nml 07/09/2020 no further needed.  Denies need for STD testing. Mammo: Nml 07/2022 plan q2 years. DEXA: 09/2022 osteopenia, vit D  Colon:  IFOB  Positive, 2019.. neg colonoscopy.. nml, repeat 10 years.  HCV screen: neg   ETOH 1 drink in a year   Problem List Items Addressed This Visit     MDD (major depressive disorder), recurrent episode, moderate (HCC)    Stable, chronic.  Continue current medication.  Sertraline 50 mg p.o. daily       Other Visit Diagnoses     Routine general medical examination at a health care facility    -  Primary        [name], MD

## 2023-09-13 ENCOUNTER — Other Ambulatory Visit: Payer: Self-pay | Admitting: Family Medicine

## 2023-09-14 MED ORDER — ZOLPIDEM TARTRATE 5 MG PO TABS: 5.0000 mg | ORAL_TABLET | Freq: Every evening | ORAL | 0 refills | Status: DC | PRN

## 2023-09-14 NOTE — Telephone Encounter (Signed)
Last office visit 07/25/23 for CPE.  Last refilled 06/13/2023 for #90 with no refills.  Next Appt: No future appointments with PCP.

## 2023-12-10 ENCOUNTER — Other Ambulatory Visit: Payer: Self-pay | Admitting: Family Medicine

## 2023-12-11 NOTE — Telephone Encounter (Signed)
 Last office visit 07/25/2023 for CPE.  Last refilled 09/14/2023 for #90 with no refills.  Next Appt: CPE 07/25/24.

## 2023-12-13 ENCOUNTER — Other Ambulatory Visit: Payer: Self-pay | Admitting: Family Medicine

## 2023-12-13 MED ORDER — ZOLPIDEM TARTRATE 5 MG PO TABS: 5.0000 mg | ORAL_TABLET | Freq: Every evening | ORAL | 0 refills | Status: DC | PRN

## 2024-03-12 ENCOUNTER — Other Ambulatory Visit: Payer: Self-pay | Admitting: Family Medicine

## 2024-03-13 NOTE — Telephone Encounter (Signed)
 Last office visit 07/25/2023 for CPE.  Last refilled 12/13/23 for #90 with no refills.  Next Appt: CPE 07/25/2024.

## 2024-06-11 ENCOUNTER — Other Ambulatory Visit: Payer: Self-pay | Admitting: Family Medicine

## 2024-06-11 NOTE — Telephone Encounter (Signed)
 Last office visit 07/25/2023 for CPE.  Last refilled 03/13/24 for #90 with no refills.  Next Appt: CPE 07/25/2024.

## 2024-06-14 ENCOUNTER — Other Ambulatory Visit: Payer: Self-pay | Admitting: *Deleted

## 2024-06-14 DIAGNOSIS — Z23 Encounter for immunization: Secondary | ICD-10-CM

## 2024-06-14 MED ORDER — COVID-19 MRNA VACC (MODERNA) 50 MCG/0.5ML IM SUSP: 0.5000 mL | Freq: Once | INTRAMUSCULAR | 0 refills | Status: AC

## 2024-06-14 NOTE — Telephone Encounter (Signed)
 Patient is 17 years and older with 1 high risk medical condition, BMI: Please provide COVID vaccine prescription for her.

## 2024-06-14 NOTE — Telephone Encounter (Signed)
 Copied from CRM [id number]. Topic: General - Other >> Jun 14, 2024 10:12 AM Thersia BROCKS wrote: Reason for CRM: Patient called in regarding the covid vaccine, would like it sent to   [facility] [address] [place] - [place], [place] - [address] [facility] [address] [place] [address] Phone: [phone] Fax: [phone]

## 2024-06-14 NOTE — Telephone Encounter (Signed)
 Margaret Short notified by telephone that Rx for Covid Vaccine has been sent to [facility] as requested.

## 2024-07-04 ENCOUNTER — Telehealth: Payer: Self-pay | Admitting: *Deleted

## 2024-07-04 DIAGNOSIS — Z13220 Encounter for screening for lipoid disorders: Secondary | ICD-10-CM

## 2024-07-04 NOTE — Telephone Encounter (Signed)
-----   Message from Harlene Du sent at 07/04/2024  3:13 PM EDT ----- Regarding: Lab Thurs 07/18/24 Hello,  Patient is coming in for CPE labs on Thursday 07/18/24. Can we get orders please.   Thanks

## 2024-07-16 ENCOUNTER — Encounter: Payer: Self-pay | Admitting: *Deleted

## 2024-07-16 NOTE — Telephone Encounter (Signed)
-----   Message from Veva JINNY Ferrari sent at 07/15/2024  2:28 PM EDT ----- Regarding: Lab orders for Friday, 10.10.25 Patient is scheduled for CPX labs, please order future labs, Thanks , Veva

## 2024-07-16 NOTE — Telephone Encounter (Signed)
 This encounter was created in error - please disregard.

## 2024-07-18 ENCOUNTER — Other Ambulatory Visit: Payer: BLUE CROSS/BLUE SHIELD

## 2024-07-19 ENCOUNTER — Ambulatory Visit: Payer: Self-pay | Admitting: Family Medicine

## 2024-07-19 ENCOUNTER — Other Ambulatory Visit (INDEPENDENT_AMBULATORY_CARE_PROVIDER_SITE_OTHER)

## 2024-07-19 DIAGNOSIS — Z13220 Encounter for screening for lipoid disorders: Secondary | ICD-10-CM | POA: Diagnosis not present

## 2024-07-19 LAB — COMPREHENSIVE METABOLIC PANEL WITH GFR
ALT: 18 U/L (ref 0–35)
AST: 24 U/L (ref 0–37)
Albumin: 4.2 g/dL (ref 3.5–5.2)
Alkaline Phosphatase: 74 U/L (ref 39–117)
BUN: 17 mg/dL (ref 6–23)
CO2: 28 meq/L (ref 19–32)
Calcium: 9.2 mg/dL (ref 8.4–10.5)
Chloride: 100 meq/L (ref 96–112)
Creatinine, Ser: 0.75 mg/dL (ref 0.40–1.20)
GFR: 82.39 mL/min (ref >60.00)
Glucose, Bld: 87 mg/dL (ref 70–99)
Potassium: 4.4 meq/L (ref 3.5–5.1)
Sodium: 136 meq/L (ref 135–145)
Total Bilirubin: 0.5 mg/dL (ref 0.2–1.2)
Total Protein: 7.3 g/dL (ref 6.0–8.3)

## 2024-07-19 LAB — LIPID PANEL
Cholesterol: 241 mg/dL — ABNORMAL HIGH (ref 0–200)
HDL: 85.3 mg/dL (ref >39.00)
LDL Cholesterol: 142 mg/dL — ABNORMAL HIGH (ref 0–99)
NonHDL: 155.36
Total CHOL/HDL Ratio: 3
Triglycerides: 67 mg/dL (ref 0.0–149.0)
VLDL: 13.4 mg/dL (ref 0.0–40.0)

## 2024-07-19 NOTE — Progress Notes (Signed)
 No critical labs need to be addressed urgently. We will discuss labs in detail at upcoming office visit.

## 2024-07-24 ENCOUNTER — Ambulatory Visit: Payer: BLUE CROSS/BLUE SHIELD

## 2024-07-24 VITALS — Ht 66.0 in | Wt 174.0 lb

## 2024-07-24 DIAGNOSIS — Z0000 Encounter for general adult medical examination without abnormal findings: Secondary | ICD-10-CM

## 2024-07-24 NOTE — Progress Notes (Signed)
 Subjective:   Margaret Short is a 68 y.o. who presents for a Medicare Wellness preventive visit.  As a reminder, Annual Wellness Visits don't include a physical exam, and some assessments may be limited, especially if this visit is performed virtually. We may recommend an in-person follow-up visit with your provider if needed.  Visit Complete: Virtual I connected with  ANNELL CANTY on 07/24/24 by a audio enabled telemedicine application and verified that I am speaking with the correct person using two identifiers.  Patient Location: Home  Provider Location: Office/Clinic  I discussed the limitations of evaluation and management by telemedicine. The patient expressed understanding and agreed to proceed.  Vital Signs: Because this visit was a virtual/telehealth visit, some criteria may be missing or patient reported. Any vitals not documented were not able to be obtained and vitals that have been documented are patient reported.  VideoDeclined- This patient declined Librarian, academic. Therefore the visit was completed with audio only.  Persons Participating in Visit: Patient.  AWV Questionnaire: Yes: Patient Medicare AWV questionnaire was completed by the patient on 07/17/24; I have confirmed that all information answered by patient is correct and no changes since this date.  Cardiac Risk Factors include: advanced age (>57men, >34 women)    Objective:    Today's Vitals   07/24/24 1141  Weight: 174 lb (78.9 kg)  Height: 5' 6 (1.676 m)   Body mass index is 28.08 kg/m.     07/24/2024   11:45 AM 07/24/2023    2:32 PM 06/19/2018    7:11 AM 05/16/2016    2:06 PM  Advanced Directives  Does Patient Have a Medical Advance Directive? Yes Yes Yes  Yes   Type of Advance Directive Living will;Healthcare Power of [employer] Power of [place];Living will Living will Healthcare Power of [place];Living will   Does patient want to make changes to  medical advance directive?    No - Patient declined   Copy of Healthcare Power of Attorney in Chart? Yes - validated most recent copy scanned in chart (See row information) No - copy requested  No - copy requested      Data saved with a previous flowsheet row definition    Current Medications (verified) Outpatient Encounter Medications as of 07/24/2024  Medication Sig   oxybutynin  (DITROPAN -XL) 5 MG 24 hr tablet TAKE ONE TABLET BY MOUTH EVERY OTHER DAY   sertraline  (ZOLOFT ) 50 MG tablet TAKE ONE TABLET BY MOUTH ONE TIME DAILY   zolpidem  (AMBIEN ) 5 MG tablet TAKE ONE TABLET BY MOUTH AT BEDTIME AS NEEDED FOR SLEEP   No facility-administered encounter medications on file as of 07/24/2024.    Allergies (verified) Patient has no known allergies.   History: Past Medical History:  Diagnosis Date   Anxiety Part of depression   No recent depression or anxiety in past year or more   Cataract Noted at ophthalmology appt 02/2023 dr devonda   Depression    Overactive bladder    Past Surgical History:  Procedure Laterality Date   COLONOSCOPY WITH PROPOFOL  N/A 06/19/2018   Procedure: COLONOSCOPY WITH PROPOFOL ;  Surgeon: Therisa Bi, MD;  Location: [facility] ENDOSCOPY;  Service: Gastroenterology;  Laterality: N/A;   CORNEAL TRANSPLANT Right    EYE SURGERY  Keratolasty 1998   Keraplasty  1996   LAMELLAR KERATOPLASTY  1985   partial   Family History  Problem Relation Age of Onset   Cancer Father        oral cancer  due to tobacco use   Hypertension Mother    Osteoarthritis Mother    Healthy Brother    Healthy Brother    Healthy Brother    Multiple sclerosis Paternal Aunt    Breast cancer Neg Hx    Social History   Socioeconomic History   Marital status: Single    Spouse name: Not on file   Number of children: Not on file   Years of education: Not on file   Highest education level: Doctorate  Occupational History   Occupation: Professor    Employer: [employer]  Tobacco Use    Smoking status: Former    Current packs/day: 0.00    Types: Cigarettes    Quit date: 1981    Years since quitting: 44.8   Smokeless tobacco: Never  Substance and Sexual Activity   Alcohol use: No   Drug use: No   Sexual activity: Never  Other Topics Concern   Not on file  Social History Narrative   Regular exercise:yes, daily treadmill   Diet: veggies, water minimal          Social Drivers of Health   Financial Resource Strain: Low Risk  (07/21/2024)   Overall Financial Resource Strain (CARDIA)    Difficulty of Paying Living Expenses: Not hard at all  Food Insecurity: No Food Insecurity (07/21/2024)   Hunger Vital Sign    Worried About Running Out of Food in the Last Year: Never true    Ran Out of Food in the Last Year: Never true  Transportation Needs: No Transportation Needs (07/21/2024)   PRAPARE - Administrator, Civil Service (Medical): No    Lack of Transportation (Non-Medical): No  Physical Activity: Sufficiently Active (07/21/2024)   Exercise Vital Sign    Days of Exercise per Week: 7 days    Minutes of Exercise per Session: 30 min  Stress: No Stress Concern Present (07/21/2024)   [employer] of Occupational Health - Occupational Stress Questionnaire    Feeling of Stress: Only a little  Social Connections: Socially Isolated (07/21/2024)   Social Connection and Isolation Panel    Frequency of Communication with Friends and Family: Three times a week    Frequency of Social Gatherings with Friends and Family: Twice a week    Attends Religious Services: Patient declined    Database administrator or Organizations: No    Attends Engineer, structural: Not on file    Marital Status: Never married    Tobacco Counseling Counseling given: Not Answered   Clinical Intake:  Pre-visit preparation completed: Yes  Pain : No/denies pain    BMI - recorded: 28.08 Nutritional Status: BMI 25 -29 Overweight Nutritional Risks: None Diabetes:  No  No results found for: HGBA1C   How often do you need to have someone help you when you read instructions, pamphlets, or other written materials from your doctor or pharmacy?: 1 - Never  Interpreter Needed?: No  Comments: lives alone Information entered by :: B.Stephen Baruch,LPN   Activities of Daily Living     07/17/2024    9:28 AM  In your present state of health, do you have any difficulty performing the following activities:  Hearing? 0  Vision? 0  Difficulty concentrating or making decisions? 0  Walking or climbing stairs? 0  Dressing or bathing? 0  Doing errands, shopping? 0  Preparing Food and eating ? N  Using the Toilet? N  In the past six months, have you accidently leaked urine?  N  Do you have problems with loss of bowel control? N  Managing your Medications? N  Managing your Finances? N  Housekeeping or managing your Housekeeping? N    Patient Care Team: Avelina Greig BRAVO, MD as PCP - General (Family Medicine) Jaye Fallow, MD as Referring Physician (Ophthalmology)  I have updated your Care Teams any recent Medical Services you may have received from other providers in the past year.     Assessment:   This is a routine wellness examination for Marchele.  Hearing/Vision screen Hearing Screening - Comments:: Patient denies any hearing difficulties.   Vision Screening - Comments:: Pt says their vision is good with glasses for reading Dr  Jaye   Goals Addressed             This Visit's Progress    COMPLETED: DIET - INCREASE WATER INTAKE       Patient Stated       No new goals       Depression Screen     07/24/2024   11:44 AM 07/25/2023    2:16 PM 07/24/2023    2:31 PM 07/21/2022    8:48 AM 07/13/2021    2:37 PM 06/28/2019    3:42 PM 02/26/2019    8:12 AM  PHQ 2/9 Scores  PHQ - 2 Score 0 0 0 0 0 0 0  PHQ- 9 Score  0  0 0  0    Fall Risk     07/17/2024    9:28 AM 07/24/2023    2:30 PM 07/19/2023    8:57 AM 07/21/2022    8:37 AM   Fall Risk   Falls in the past year? 0 0 0 0  Number falls in past yr:  0    Injury with Fall?  0    Risk for fall due to : No Fall Risks No Fall Risks    Follow up Education provided;Falls prevention discussed Falls prevention discussed      MEDICARE RISK AT HOME:  Medicare Risk at Home Any stairs in or around the home?: (Patient-Rptd) No Home free of loose throw rugs in walkways, pet beds, electrical cords, etc?: (Patient-Rptd) Yes Adequate lighting in your home to reduce risk of falls?: (Patient-Rptd) Yes Life alert?: (Patient-Rptd) No Use of a cane, walker or w/c?: (Patient-Rptd) No Grab bars in the bathroom?: (Patient-Rptd) No Shower chair or bench in shower?: (Patient-Rptd) No Elevated toilet seat or a handicapped toilet?: (Patient-Rptd) No  TIMED UP AND GO:  Was the test performed?  No  Cognitive Function: 6CIT completed        07/24/2024   11:46 AM 07/24/2023    2:33 PM  6CIT Screen  What Year? 0 points 0 points  What month? 0 points 0 points  What time? 0 points 0 points  Count back from 20 0 points 0 points  Months in reverse 0 points 0 points  Repeat phrase 0 points 0 points  Total Score 0 points 0 points    Immunizations Immunization History  Administered Date(s) Administered   Fluad Quad(high Dose 65+) 06/26/2022   Fluad Trivalent(High Dose 65+) 06/20/2023   INFLUENZA, HIGH DOSE SEASONAL PF 06/14/2024   Influenza Inj Mdck Quad Pf 06/12/2021   Influenza Whole 07/10/2010   Influenza, Seasonal, Injecte, Preservative Fre 08/10/2014   Influenza,inj,Quad PF,6+ Mos 06/05/2018, 06/07/2019   Influenza-Unspecified 07/11/2016, 06/20/2017, 06/08/2020   Moderna Covid-19 Fall Seasonal Vaccine 13yrs & older 06/29/2022, 06/20/2023   Moderna Covid-19 Vaccine Bivalent Booster 53yrs &  up 06/12/2021   Moderna SARS-COV2 Booster Vaccination 06/12/2021, 06/29/2022   Moderna Sars-Covid-2 Vaccination 12/20/2019, 01/18/2020, 08/09/2020, 01/07/2021, 06/20/2023   PNEUMOCOCCAL  CONJUGATE-20 07/21/2022   Pfizer(Comirnaty)Fall Seasonal Vaccine 12 years and older 06/14/2024   Td 10/11/2003   Tdap 04/18/2014, 11/15/2023   Zoster Recombinant(Shingrix ) 07/09/2020, 09/07/2020    Screening Tests Health Maintenance  Topic Date Due   Mammogram  09/27/2024   COVID-19 Vaccine (10 - 2025-26 season) 12/12/2024   Medicare Annual Wellness (AWV)  07/24/2025   DTaP/Tdap/Td (4 - Td or Tdap) 11/14/2033   Pneumococcal Vaccine: 50+ Years  Completed   Influenza Vaccine  Completed   DEXA SCAN  Completed   Hepatitis C Screening  Completed   Zoster Vaccines- Shingrix   Completed   Meningococcal B Vaccine  Aged Out    Health Maintenance Items Addressed: None at this time  Additional Screening:  Vision Screening: Recommended annual ophthalmology exams for early detection of glaucoma and other disorders of the eye. Is the patient up to date with their annual eye exam?  Yes  Who is the provider or what is the name of the office in which the patient attends annual eye exams? Dr Jaye  Dental Screening: Recommended annual dental exams for proper oral hygiene  Community Resource Referral / Chronic Care Management: CRR required this visit?  No   CCM required this visit?  No   Plan:    I have personally reviewed and noted the following in the patient's chart:   Medical and social history Use of alcohol, tobacco or illicit drugs  Current medications and supplements including opioid prescriptions. Patient is not currently taking opioid prescriptions. Functional ability and status Nutritional status Physical activity Advanced directives List of other physicians Hospitalizations, surgeries, and ER visits in previous 12 months Vitals Screenings to include cognitive, depression, and falls Referrals and appointments  In addition, I have reviewed and discussed with patient certain preventive protocols, quality metrics, and best practice recommendations. A written  personalized care plan for preventive services as well as general preventive health recommendations were provided to patient.   Erminio LITTIE Saris, LPN   [id number]   After Visit Summary: (MyChart) Due to this being a telephonic visit, the after visit summary with patients personalized plan was offered to patient via MyChart   Notes: Nothing significant to report at this time.

## 2024-07-24 NOTE — Patient Instructions (Signed)
 Margaret Short,  Thank you for taking the time for your Medicare Wellness Visit. I appreciate your continued commitment to your health goals. Please review the care plan we discussed, and feel free to reach out if I can assist you further.  Medicare recommends these wellness visits once per year to help you and your care team stay ahead of potential health issues. These visits are designed to focus on prevention, allowing your provider to concentrate on managing your acute and chronic conditions during your regular appointments.  Please note that Annual Wellness Visits do not include a physical exam. Some assessments may be limited, especially if the visit was conducted virtually. If needed, we may recommend a separate in-person follow-up with your provider.  Ongoing Care Seeing your primary care provider every 3 to 6 months helps us  monitor your health and provide consistent, personalized care.   Referrals If a referral was made during today's visit and you haven't received any updates within two weeks, please contact the referred provider directly to check on the status.  Recommended Screenings:  Health Maintenance  Topic Date Due   Breast Cancer Screening  09/27/2024   COVID-19 Vaccine (10 - 2025-26 season) 12/12/2024   Medicare Annual Wellness Visit  07/24/2025   DTaP/Tdap/Td vaccine (4 - Td or Tdap) 11/14/2033   Pneumococcal Vaccine for age over 86  Completed   Flu Shot  Completed   DEXA scan (bone density measurement)  Completed   Hepatitis C Screening  Completed   Zoster (Shingles) Vaccine  Completed   Meningitis B Vaccine  Aged Out       07/24/2023    2:32 PM  Advanced Directives  Does Patient Have a Medical Advance Directive? Yes  Type of Estate agent of [place];Living will  Copy of Healthcare Power of Attorney in Chart? No - copy requested   Advance Care Planning is important because it: Ensures you receive medical care that aligns with your values,  goals, and preferences. Provides guidance to your family and loved ones, reducing the emotional burden of decision-making during critical moments.  Vision: Annual vision screenings are recommended for early detection of glaucoma, cataracts, and diabetic retinopathy. These exams can also reveal signs of chronic conditions such as diabetes and high blood pressure.  Dental: Annual dental screenings help detect early signs of oral cancer, gum disease, and other conditions linked to overall health, including heart disease and diabetes.

## 2024-07-25 ENCOUNTER — Ambulatory Visit (INDEPENDENT_AMBULATORY_CARE_PROVIDER_SITE_OTHER): Payer: BLUE CROSS/BLUE SHIELD | Admitting: Family Medicine

## 2024-07-25 ENCOUNTER — Other Ambulatory Visit: Payer: Self-pay | Admitting: Family Medicine

## 2024-07-25 ENCOUNTER — Encounter: Payer: Self-pay | Admitting: Family Medicine

## 2024-07-25 VITALS — BP 118/82 | HR 68 | Temp 98.7°F | Ht 65.5 in | Wt 172.1 lb

## 2024-07-25 DIAGNOSIS — E7800 Pure hypercholesterolemia, unspecified: Secondary | ICD-10-CM | POA: Diagnosis not present

## 2024-07-25 DIAGNOSIS — G4700 Insomnia, unspecified: Secondary | ICD-10-CM

## 2024-07-25 DIAGNOSIS — F331 Major depressive disorder, recurrent, moderate: Secondary | ICD-10-CM

## 2024-07-25 DIAGNOSIS — Z1231 Encounter for screening mammogram for malignant neoplasm of breast: Secondary | ICD-10-CM

## 2024-07-25 NOTE — Assessment & Plan Note (Signed)
Stable, chronic.  Continue current medication.   Ambien 5 mg p.o. bedtime

## 2024-07-25 NOTE — Assessment & Plan Note (Signed)
 Chronic, increased.  5.5% 10 year CVD risk  No known family history.  Pt wished to continue to follow.. she will cut back on cheese etc. Offered further risk stratification with CAC or LipoA... She will consider. Not interested in statin.  Info  given on natural treatment of high cholesterol.

## 2024-07-25 NOTE — Progress Notes (Signed)
 Patient ID: Margaret Short, female    DOB: [birth year], 68 y.o.   MRN: [id number]  This visit was conducted in person.  BP 118/82   Pulse 68   Temp 98.7 F (37.1 C) (Oral)   Ht 5' 5.5 (1.664 m)   Wt 172 lb 2 oz (78.1 kg)   SpO2 98%   BMI 28.21 kg/m    CC:  Chief Complaint  Patient presents with   Annual Exam    Subjective:   HPI: Margaret Short is a 68 y.o. female presenting on 07/25/2024 for Annual Exam  The patient presents for   review of chronic health problems. He/She also has the following acute concerns today: none  The patient saw a LPN or RN for medicare wellness visit. 07/24/2024  Prevention and wellness was reviewed in detail. Note reviewed and important notes copied below.  No falls in last 12 months.  MDD, insomnia: stable on current regimen. Sertraline  50 mg daily  Using Ambein at night for sleep. Flowsheet Row Office Visit from 07/25/2024 in [facility]  PHQ-2 Total Score 0     OAB controlled on Ditropan  XL  three day a week.     Wt Readings from Last 3 Encounters:  07/25/24 172 lb 2 oz (78.1 kg)  07/24/24 174 lb (78.9 kg)  07/25/23 174 lb 8 oz (79.2 kg)  Body mass index is 28.21 kg/m.   Diet:  moderate  Exercise: walking, strength training daily.  She is volunteering weekly.  BP Readings from Last 3 Encounters:  07/25/24 118/82  07/25/23 136/76  07/21/22 130/80     Seeing Derm yearly.  [facility]. Dr. Arlyss   Mother with Alzheimers, uncle as well. Age 33s.    No known family history of CAD premature The 10-year ASCVD risk score (Arnett DK, et al., 2019) is: 5.5%   Values used to calculate the score:     Age: 62 years     Clincally relevant sex: Female     Is Non-Hispanic African American: No     Diabetic: No     Tobacco smoker: No     Systolic Blood Pressure: 118 mmHg     Is BP treated: No     HDL Cholesterol: 85.3 mg/dL     Total Cholesterol: 241 mg/dL  Relevant past  medical, surgical, family and social history reviewed and updated as indicated. Interim medical history since our last visit reviewed. Allergies and medications reviewed and updated. Outpatient Medications Prior to Visit  Medication Sig Dispense Refill   oxybutynin  (DITROPAN -XL) 5 MG 24 hr tablet TAKE ONE TABLET BY MOUTH EVERY OTHER DAY 45 tablet 3   sertraline  (ZOLOFT ) 50 MG tablet TAKE ONE TABLET BY MOUTH ONE TIME DAILY 90 tablet 1   zolpidem  (AMBIEN ) 5 MG tablet TAKE ONE TABLET BY MOUTH AT BEDTIME AS NEEDED FOR SLEEP 90 tablet 0   No facility-administered medications prior to visit.     Per HPI unless specifically indicated in ROS section below Review of Systems  Constitutional:  Negative for fatigue and fever.  HENT:  Negative for congestion.   Eyes:  Negative for pain.  Respiratory:  Negative for cough and shortness of breath.   Cardiovascular:  Negative for chest pain, palpitations and leg swelling.  Gastrointestinal:  Negative for abdominal pain.  Genitourinary:  Negative for dysuria and vaginal bleeding.  Musculoskeletal:  Negative for back pain.  Neurological:  Negative for syncope, light-headedness and headaches.  Psychiatric/Behavioral:  Negative for dysphoric mood.    Objective:  BP 118/82   Pulse 68   Temp 98.7 F (37.1 C) (Oral)   Ht 5' 5.5 (1.664 m)   Wt 172 lb 2 oz (78.1 kg)   SpO2 98%   BMI 28.21 kg/m   Wt Readings from Last 3 Encounters:  07/25/24 172 lb 2 oz (78.1 kg)  07/24/24 174 lb (78.9 kg)  07/25/23 174 lb 8 oz (79.2 kg)      Physical Exam Vitals and nursing note reviewed.  Constitutional:      General: She is not in acute distress.    Appearance: Normal appearance. She is well-developed. She is not ill-appearing or toxic-appearing.  HENT:     Head: Normocephalic.     Right Ear: Hearing, tympanic membrane, ear canal and external ear normal.     Left Ear: Hearing, tympanic membrane, ear canal and external ear normal.     Nose: Nose normal.   Eyes:     General: Lids are normal. Lids are everted, no foreign bodies appreciated.     Conjunctiva/sclera: Conjunctivae normal.     Pupils: Pupils are equal, round, and reactive to light.  Neck:     Thyroid: No thyroid mass or thyromegaly.     Vascular: No carotid bruit.     Trachea: Trachea normal.  Cardiovascular:     Rate and Rhythm: Normal rate and regular rhythm.     Heart sounds: Normal heart sounds, S1 normal and S2 normal. No murmur heard.    No gallop.  Pulmonary:     Effort: Pulmonary effort is normal. No respiratory distress.     Breath sounds: Normal breath sounds. No wheezing, rhonchi or rales.  Abdominal:     General: Bowel sounds are normal. There is no distension or abdominal bruit.     Palpations: Abdomen is soft. There is no fluid wave or mass.     Tenderness: There is no abdominal tenderness. There is no guarding or rebound.     Hernia: No hernia is present.  Musculoskeletal:     Cervical back: Normal range of motion and neck supple.  Lymphadenopathy:     Cervical: No cervical adenopathy.  Skin:    General: Skin is warm and dry.     Findings: No rash.  Neurological:     Mental Status: She is alert.     Cranial Nerves: No cranial nerve deficit.     Sensory: No sensory deficit.  Psychiatric:        Mood and Affect: Mood is not anxious or depressed.        Speech: Speech normal.        Behavior: Behavior normal. Behavior is cooperative.        Judgment: Judgment normal.       Results for orders placed or performed in visit on 07/19/24  Comprehensive metabolic panel   Collection Time: 07/19/24  8:23 AM  Result Value Ref Range   Sodium 136 135 - 145 mEq/L   Potassium 4.4 3.5 - 5.1 mEq/L   Chloride 100 96 - 112 mEq/L   CO2 28 19 - 32 mEq/L   Glucose, Bld 87 70 - 99 mg/dL   BUN 17 6 - 23 mg/dL   Creatinine, Ser 9.24 0.40 - 1.20 mg/dL   Total Bilirubin 0.5 0.2 - 1.2 mg/dL   Alkaline Phosphatase 74 39 - 117 U/L   AST 24 0 - 37 U/L   ALT 18 0 - 35  U/L    Total Protein 7.3 6.0 - 8.3 g/dL   Albumin 4.2 3.5 - 5.2 g/dL   GFR 17.60 >39.99 mL/min   Calcium 9.2 8.4 - 10.5 mg/dL  Lipid panel   Collection Time: 07/19/24  8:23 AM  Result Value Ref Range   Cholesterol 241 (H) 0 - 200 mg/dL   Triglycerides 32.9 0.0 - 149.0 mg/dL   HDL 14.69 >60.99 mg/dL   VLDL 86.5 0.0 - 59.9 mg/dL   LDL Cholesterol 857 (H) 0 - 99 mg/dL   Total CHOL/HDL Ratio 3    NonHDL 155.36      COVID 19 screen:  No recent travel or known exposure to COVID19 The patient denies respiratory symptoms of COVID 19 at this time. The importance of social distancing was discussed today.   Assessment and Plan The patient's preventative maintenance and recommended screening tests for an annual wellness exam were reviewed in full today. Brought up to date unless services declined.  Counselled on the importance of diet, exercise, and its role in overall health and mortality. The patient's FH and SH was reviewed, including their home life, tobacco status, and drug and alcohol status.   Vaccines: Td uptodate,   S/P shingrix , high dose flu ,COVID, prevnar 20  Nonsmoker.   DVE/PAP: DVE not indicated, pap nml 07/09/2020 no further needed.  Denies need for STD testing. Mammo: Nml 09/2022 plan q2 years. DEXA: 09/2022 -T2.1 osteopenia, vit D  consdier every 2-5 years Colon:  IFOB  Positive, 2019.. neg colonoscopy.. nml, repeat 10 years.  HCV screen: neg  ETOH none   Problem List Items Addressed This Visit     High cholesterol    Chronic, increased.  5.5% 10 year CVD risk  No known family history.  Pt wished to continue to follow.. she will cut back on cheese etc. Offered further risk stratification with CAC or LipoA... She will consider. Not interested in statin.  Info  given on natural treatment of high cholesterol.         INSOMNIA, CHRONIC - Primary   Stable, chronic.  Continue current medication.   Ambien  5 mg p.o. bedtime      MDD (major depressive disorder),  recurrent episode, moderate (HCC)   Stable, chronic.  Continue current medication.  Sertraline  50 mg p.o. daily          Greig Ring, MD

## 2024-07-25 NOTE — Patient Instructions (Signed)
Here is some info I have gathered for you for a trusted medical source. ?Lipid Management With Diet, Uptodate Feb 20.2021, Maple Mirza and Rosenson ? ?Although earlier, smaller trials suggested a benefit of garlic supplementation, a subsequent larger trial failed to demonstrate improvement in lipids with use of any of three different garlic preparations (raw, powdered, or aged). ? ?Bergamot: Improvements in serum lipids have been reported in trials of patients with metabolic syndrome, nonalcoholic fatty liver disease and in hyperlipidemic patients resistant to statin treatment. However, high-quality data on the effects of bergamot are lacking. ? ?Suggestions for you if you would like to try natural supplements to lower cholesterol. ? ?1.Souble fiber : Psyllium ?In a meta-analysis of randomized trials of patients with both normal and elevated cholesterol levels, the addition of 10.2 g/day of psyllium lowered the LDL cholesterol by an average of 12.8 mg/dL  ? ?2. Omega 3s: Mixed results in studies. Given you triglycerides are normal I would not use this. ? ?3.Red yeast rice ( 2.4 grams divided half in AM half in PM): Red yeast rice is a fermented rice product, most often taken as a supplement, which can improve serum cholesterol  via  method similar to prescription statins.  ?Red yeast rice supplements lowered total cholesterol (208 versus 251 mg/dL) and LDL cholesterol (829 versus 175 mg/dL) compared with placebo. ? ?4. Plant sterol.. There are naturally occurring sterols and stanols in nuts, legumes, whole grains, fruits, vegetables, and plant oils. In addition, a number of manufactured products enriched with plant sterols and stanols are commercially available. The margarines containing these compounds (eg, Benecol and Take Control spreads) have been available the longest and are the most studied  ?In a trial of 150 patients with mild hypercholesterolemia,those consuming the fortified margarine experienced a 10 to 14  percent decrease in total cholesterol and LDL cholesterol. ? ?5.Green Tea Catechins: .n a year-long randomized trial of more than 900 healthy postmenopausal women, green tea catechin supplements (1315 mg catechins/day) reduced total cholesterol and LDL cholesterol, increased triglycerides, and had no effect on HDL cholesterol  ? ?

## 2024-07-25 NOTE — Assessment & Plan Note (Signed)
Stable, chronic.  Continue current medication.  Sertraline 50 mg p.o. daily

## 2024-07-29 ENCOUNTER — Ambulatory Visit
Admission: RE | Admit: 2024-07-29 | Discharge: 2024-07-29 | Disposition: A | Discharge status: Home / Self Care | Source: Ambulatory Visit | Attending: Family Medicine | Admitting: Family Medicine

## 2024-07-29 DIAGNOSIS — Z1231 Encounter for screening mammogram for malignant neoplasm of breast: Secondary | ICD-10-CM | POA: Diagnosis present

## 2024-08-01 ENCOUNTER — Ambulatory Visit: Payer: Self-pay | Admitting: Family Medicine

## 2024-09-10 ENCOUNTER — Other Ambulatory Visit: Payer: Self-pay | Admitting: Family Medicine

## 2024-09-11 NOTE — Telephone Encounter (Signed)
 Last office visit 07/25/2024 for CPE.  Last refilled 06/11/2024 for #90 with no refills.  Next appt: CPE 07/30/2025.

## 2025-07-16 ENCOUNTER — Other Ambulatory Visit

## 2025-07-25 ENCOUNTER — Ambulatory Visit

## 2025-07-30 ENCOUNTER — Encounter: Admitting: Family Medicine
